# Patient Record
Sex: Female | Born: 1955
Health system: Southern US, Community
[De-identification: ages and names within clinical notes are randomized; demographics above are authoritative.]

## PROBLEM LIST (undated history)

## (undated) DIAGNOSIS — Z8042 Family history of malignant neoplasm of prostate: Secondary | ICD-10-CM

## (undated) DIAGNOSIS — E119 Type 2 diabetes mellitus without complications: Secondary | ICD-10-CM

## (undated) DIAGNOSIS — H269 Unspecified cataract: Secondary | ICD-10-CM

## (undated) DIAGNOSIS — E079 Disorder of thyroid, unspecified: Secondary | ICD-10-CM

## (undated) DIAGNOSIS — M7541 Impingement syndrome of right shoulder: Secondary | ICD-10-CM

## (undated) DIAGNOSIS — Z803 Family history of malignant neoplasm of breast: Secondary | ICD-10-CM

## (undated) DIAGNOSIS — Z800 Family history of malignant neoplasm of digestive organs: Secondary | ICD-10-CM

## (undated) DIAGNOSIS — E039 Hypothyroidism, unspecified: Secondary | ICD-10-CM

## (undated) HISTORY — DX: Family history of malignant neoplasm of prostate: Z80.42

## (undated) HISTORY — PX: OTHER SURGICAL HISTORY: SHX169

## (undated) HISTORY — DX: Unspecified cataract: H26.9

## (undated) HISTORY — DX: Family history of malignant neoplasm of breast: Z80.3

## (undated) HISTORY — PX: COLONOSCOPY: SHX174

## (undated) HISTORY — DX: Family history of malignant neoplasm of digestive organs: Z80.0

## (undated) HISTORY — DX: Type 2 diabetes mellitus without complications: E11.9

## (undated) HISTORY — DX: Disorder of thyroid, unspecified: E07.9

---

## 1999-01-14 ENCOUNTER — Other Ambulatory Visit: Admission: RE | Admit: 1999-01-14 | Discharge: 1999-01-14 | Payer: Self-pay | Admitting: Obstetrics & Gynecology

## 1999-09-05 ENCOUNTER — Encounter: Payer: Self-pay | Admitting: Obstetrics & Gynecology

## 1999-09-05 ENCOUNTER — Encounter: Admission: RE | Admit: 1999-09-05 | Discharge: 1999-09-05 | Payer: Self-pay | Admitting: Obstetrics & Gynecology

## 2000-02-25 ENCOUNTER — Other Ambulatory Visit: Admission: RE | Admit: 2000-02-25 | Discharge: 2000-02-25 | Payer: Self-pay | Admitting: Obstetrics & Gynecology

## 2000-03-09 ENCOUNTER — Encounter: Admission: RE | Admit: 2000-03-09 | Discharge: 2000-03-09 | Payer: Self-pay | Admitting: Obstetrics & Gynecology

## 2000-03-09 ENCOUNTER — Encounter: Payer: Self-pay | Admitting: Obstetrics & Gynecology

## 2000-06-18 ENCOUNTER — Emergency Department (HOSPITAL_COMMUNITY): Admission: EM | Admit: 2000-06-18 | Discharge: 2000-06-18 | Payer: Self-pay | Admitting: *Deleted

## 2000-11-09 ENCOUNTER — Encounter: Admission: RE | Admit: 2000-11-09 | Discharge: 2000-11-11 | Payer: Self-pay | Admitting: Endocrinology

## 2000-11-30 ENCOUNTER — Encounter: Admission: RE | Admit: 2000-11-30 | Discharge: 2001-02-28 | Payer: Self-pay | Admitting: Endocrinology

## 2001-02-17 ENCOUNTER — Other Ambulatory Visit: Admission: RE | Admit: 2001-02-17 | Discharge: 2001-02-17 | Payer: Self-pay | Admitting: Obstetrics & Gynecology

## 2002-02-03 ENCOUNTER — Encounter: Admission: RE | Admit: 2002-02-03 | Discharge: 2002-03-21 | Payer: Self-pay | Admitting: Occupational Medicine

## 2002-03-16 ENCOUNTER — Other Ambulatory Visit: Admission: RE | Admit: 2002-03-16 | Discharge: 2002-03-16 | Payer: Self-pay | Admitting: Obstetrics & Gynecology

## 2003-03-29 ENCOUNTER — Other Ambulatory Visit: Admission: RE | Admit: 2003-03-29 | Discharge: 2003-03-29 | Payer: Self-pay | Admitting: Obstetrics & Gynecology

## 2003-10-27 ENCOUNTER — Encounter: Admission: RE | Admit: 2003-10-27 | Discharge: 2003-10-27 | Payer: Self-pay | Admitting: Obstetrics & Gynecology

## 2004-04-19 ENCOUNTER — Other Ambulatory Visit: Admission: RE | Admit: 2004-04-19 | Discharge: 2004-04-19 | Payer: Self-pay | Admitting: Obstetrics & Gynecology

## 2004-11-13 ENCOUNTER — Encounter: Admission: RE | Admit: 2004-11-13 | Discharge: 2004-11-13 | Payer: Self-pay | Admitting: Obstetrics & Gynecology

## 2004-12-11 ENCOUNTER — Encounter: Admission: RE | Admit: 2004-12-11 | Discharge: 2004-12-11 | Payer: Self-pay | Admitting: Orthopedic Surgery

## 2005-04-16 ENCOUNTER — Encounter: Admission: RE | Admit: 2005-04-16 | Discharge: 2005-04-16 | Payer: Self-pay | Admitting: Obstetrics & Gynecology

## 2005-05-26 ENCOUNTER — Other Ambulatory Visit: Admission: RE | Admit: 2005-05-26 | Discharge: 2005-05-26 | Payer: Self-pay | Admitting: Obstetrics & Gynecology

## 2005-11-25 ENCOUNTER — Encounter: Admission: RE | Admit: 2005-11-25 | Discharge: 2005-11-25 | Payer: Self-pay | Admitting: Obstetrics & Gynecology

## 2006-01-12 ENCOUNTER — Ambulatory Visit: Payer: Self-pay | Admitting: Pulmonary Disease

## 2006-01-12 ENCOUNTER — Inpatient Hospital Stay (HOSPITAL_COMMUNITY): Admission: EM | Admit: 2006-01-12 | Discharge: 2006-01-21 | Payer: Self-pay | Admitting: Emergency Medicine

## 2006-01-14 ENCOUNTER — Ambulatory Visit: Payer: Self-pay | Admitting: Cardiology

## 2006-05-08 ENCOUNTER — Ambulatory Visit: Payer: Self-pay

## 2006-07-28 ENCOUNTER — Ambulatory Visit: Payer: Self-pay | Admitting: Internal Medicine

## 2006-08-20 ENCOUNTER — Ambulatory Visit (HOSPITAL_COMMUNITY): Admission: RE | Admit: 2006-08-20 | Discharge: 2006-08-20 | Payer: Self-pay | Admitting: Internal Medicine

## 2006-09-03 ENCOUNTER — Ambulatory Visit: Payer: Self-pay | Admitting: Internal Medicine

## 2006-12-21 ENCOUNTER — Encounter: Admission: RE | Admit: 2006-12-21 | Discharge: 2006-12-21 | Payer: Self-pay | Admitting: Obstetrics & Gynecology

## 2008-01-03 ENCOUNTER — Encounter: Admission: RE | Admit: 2008-01-03 | Discharge: 2008-01-03 | Payer: Self-pay | Admitting: Obstetrics & Gynecology

## 2009-01-13 ENCOUNTER — Encounter: Admission: RE | Admit: 2009-01-13 | Discharge: 2009-01-13 | Payer: Self-pay | Admitting: Internal Medicine

## 2009-01-16 ENCOUNTER — Encounter: Admission: RE | Admit: 2009-01-16 | Discharge: 2009-01-16 | Payer: Self-pay | Admitting: Obstetrics & Gynecology

## 2010-01-28 ENCOUNTER — Encounter: Admission: RE | Admit: 2010-01-28 | Discharge: 2010-01-28 | Payer: Self-pay | Admitting: Obstetrics & Gynecology

## 2010-02-14 ENCOUNTER — Encounter: Admission: RE | Admit: 2010-02-14 | Discharge: 2010-02-14 | Payer: Self-pay | Admitting: Obstetrics & Gynecology

## 2010-08-30 NOTE — Assessment & Plan Note (Signed)
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  She  is a diabetic, on insulin pump, and she is here today because of  findings of a polypoid growth in the rectum on exam by Dr. Jennette Kettle.  Apparently these growths were not confirmed on previous exams.  Miranda Shaw  is not really aware of any problems concerning her rectum.  She has  regular bowel habits, no rectal bleeding, no pain irritation, no history  of rectal surgery.  She has never had a colonoscopy or flexible  sigmoidoscopy.   MEDICATIONS:  1. Humalog insulin pump.  2. Levothroid 0.125 mcg daily.  3. Advil p.r.n.   PAST HISTORY:  Significant for:  1. Diabetes of 24 years.  2. Thyroid problems for 20 years.   OPERATIONS:  None.   FAMILY HISTORY:  Positive for breast cancer.   SOCIAL HISTORY:  1. She is single.  2. Has a Bachelor's degree.  3. Works in Art therapist.  4. She does smoke.  5. Does not drink alcohol.   REVIEW OF SYSTEMS:  Significant only for some voice changes.   PHYSICAL EXAM:  Blood pressure 96/62, pulse 80 and weight 136 pounds.  She was alert, oriented, in no distress.  LUNGS:  Clear to auscultation.  COR:  Normal S1, normal S2.  ABDOMEN:  Soft, normoactive bowel sounds.  ANOSCOPIC AND RECTAL EXAM:  There were two papillomatous growths  protruding through the anal canal which were easily reduced into the  anal canal into the rectal ampulla.  The rectal tone itself was normal,  there was no tenderness or irritation.  Rectal ampulla itself appeared  normal.  There were no hemorrhoids but there was a very prominent  dentate line which produced at least two large  papillomatous growths or  hypertrophied papillae.  They had a pyramidal shape and were rather  firm.  They showed no evidence of bleeding and they all appeared  fibrotic.  They were clearly located at the dentate line.  They were  easily reduced through the anal canal.  Stool was hemoccult negative.   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   PLAN:  Since patient is asymptomatic with them, I would not recommend  removing them.  The papillae usually do not have any predisposition to  become malignant and the indication for removal would be if they get  irritated when she wipes or if they bleed or if they protrude and cause  discomfort, but the patient is at the age where she ought to have a  screening colonoscopy.  She has no risk factors for it other than her  age of 15.  For that reason, we discussed colorectal screening and she  went ahead and scheduled her colon exam using a  routine colonoscopy  prep.  She will reduce her insulin pump to basal rate when she is on  clear liquids 1 day prior to the procedure and on the day of the  procedure.  She will follow up with Dr. Evlyn Kanner and Dr. Jennette Kettle for her  health maintenance.     Hedwig Morton. Juanda Chance, MD  Electronically Signed    DMB/MedQ  DD: 07/28/2006  DT: 07/28/2006  Job #: [id number]   cc:   Freddy Finner, M.D.  Tera Mater. Evlyn Kanner, M.D.

## 2011-01-07 ENCOUNTER — Other Ambulatory Visit: Payer: Self-pay | Admitting: Obstetrics & Gynecology

## 2011-01-07 DIAGNOSIS — Z1231 Encounter for screening mammogram for malignant neoplasm of breast: Secondary | ICD-10-CM

## 2011-01-08 ENCOUNTER — Other Ambulatory Visit: Payer: Self-pay | Admitting: Obstetrics & Gynecology

## 2011-01-08 DIAGNOSIS — M8580 Other specified disorders of bone density and structure, unspecified site: Secondary | ICD-10-CM

## 2011-02-26 ENCOUNTER — Ambulatory Visit: Payer: Self-pay

## 2011-02-26 ENCOUNTER — Other Ambulatory Visit: Payer: Self-pay

## 2011-03-19 ENCOUNTER — Other Ambulatory Visit: Payer: Self-pay

## 2011-03-19 ENCOUNTER — Ambulatory Visit: Payer: Self-pay

## 2011-03-26 ENCOUNTER — Ambulatory Visit
Admission: RE | Admit: 2011-03-26 | Discharge: 2011-03-26 | Disposition: A | Discharge status: Home / Self Care | Payer: PRIVATE HEALTH INSURANCE | Source: Ambulatory Visit | Attending: Obstetrics & Gynecology | Admitting: Obstetrics & Gynecology

## 2011-03-26 DIAGNOSIS — M8580 Other specified disorders of bone density and structure, unspecified site: Secondary | ICD-10-CM

## 2011-03-26 DIAGNOSIS — Z1231 Encounter for screening mammogram for malignant neoplasm of breast: Secondary | ICD-10-CM

## 2011-04-02 ENCOUNTER — Other Ambulatory Visit: Payer: Self-pay | Admitting: Obstetrics & Gynecology

## 2011-04-02 DIAGNOSIS — R928 Other abnormal and inconclusive findings on diagnostic imaging of breast: Secondary | ICD-10-CM

## 2011-04-17 ENCOUNTER — Ambulatory Visit
Admission: RE | Admit: 2011-04-17 | Discharge: 2011-04-17 | Disposition: A | Discharge status: Home / Self Care | Payer: PRIVATE HEALTH INSURANCE | Source: Ambulatory Visit | Attending: Obstetrics & Gynecology | Admitting: Obstetrics & Gynecology

## 2011-04-17 DIAGNOSIS — R928 Other abnormal and inconclusive findings on diagnostic imaging of breast: Secondary | ICD-10-CM

## 2011-09-25 ENCOUNTER — Ambulatory Visit (INDEPENDENT_AMBULATORY_CARE_PROVIDER_SITE_OTHER): Payer: PRIVATE HEALTH INSURANCE | Admitting: Family Medicine

## 2011-09-25 VITALS — BP 126/77 | HR 91 | Temp 97.6°F | Resp 16 | Ht 65.5 in | Wt 149.0 lb

## 2011-09-25 DIAGNOSIS — L723 Sebaceous cyst: Secondary | ICD-10-CM

## 2011-09-25 DIAGNOSIS — L0291 Cutaneous abscess, unspecified: Secondary | ICD-10-CM

## 2011-09-25 MED ORDER — DOXYCYCLINE HYCLATE 100 MG PO TABS: 100.0000 mg | ORAL_TABLET | Freq: Two times a day (BID) | ORAL | Status: AC

## 2011-09-25 NOTE — Progress Notes (Signed)
Subjective: 56 year old lady who is here for a cyst underneath her right breast. She is known that's been there for several years. It has stayed about the same size. Its been looked at in she's been told that it was a harmless cyst. Over the last 3 or 4 days it has gotten larger. Yesterday some malodorous secretion was coming out of it. There is just a little tiny poor that is open. She has had some breast evaluation last winter. She had a mammogram that was done in December and then following that had to have ultrasound in another view of her left breast. That was all benign. She's not any cysts elsewhere. She does have diabetes, has had it for 30 some years, and is on an insulin pump.  Objective: Underneath her right breast, about midline under the nipple line , she has a mildly erythematous cystic nodule. This measures approximately 4 cm in diameter. There is a tiny right lobe at the apex of the cyst. In just gentle squeezing it I did not get anything out of it. No axillary node.  Assessment: Infected sebaceous cyst  Plan: I&D of cyst Antibiotics

## 2011-09-25 NOTE — Progress Notes (Signed)
VCO. Local anesthesia with 1.5 cc 2% lidocaine plain.  Prep with betadine.  4 mm punch to open the cyst over the dilated pore.  Moderate purulence and moderate sebaceous material expressed.  Culture obtained. Parts of the cyst sac also removed.  Wound irrigated with 3 cc 2% lidocaine plain.  Gently packed with 1/4 inch plain packing.  Telfa, gauze and Hypafix dressing applied.  Warm compresses.  Return for wound care on Saturday 09/27/2011 with Ms. Davonna Belling, PA-C or Ms. Marte, PA-C.

## 2011-09-25 NOTE — Patient Instructions (Signed)
Use a warm compress on the area for 20 minutes twice daily.  Replace the dressing if it becomes saturated, leaks or gets wet.

## 2011-09-27 ENCOUNTER — Ambulatory Visit (INDEPENDENT_AMBULATORY_CARE_PROVIDER_SITE_OTHER): Payer: PRIVATE HEALTH INSURANCE | Admitting: Family Medicine

## 2011-09-27 VITALS — BP 111/77 | HR 101 | Temp 98.2°F | Resp 16 | Ht 65.5 in | Wt 146.0 lb

## 2011-09-27 DIAGNOSIS — Z09 Encounter for follow-up examination after completed treatment for conditions other than malignant neoplasm: Secondary | ICD-10-CM

## 2011-09-27 DIAGNOSIS — Z5189 Encounter for other specified aftercare: Secondary | ICD-10-CM

## 2011-09-27 NOTE — Progress Notes (Signed)
    Patient Name: Miranda Shaw Date of Birth: April 13, 1956 Medical Record Number: [id number] Gender: female Date of Encounter: 09/27/2011  History of Present Illness:  Miranda Shaw is a 56 y.o. very pleasant female patient who presents with the following:  Here for wound care- see I and D sebaceous cyst under right breast 2 days ago.  She feels that she is doing very well, her pain is much decreased.  No fever, tolerating abx ok  There is no problem list on file for this patient.  No past medical history on file. No past surgical history on file. History  Substance Use Topics  . Smoking status: Former Smoker    Quit date: 09/26/2001  . Smokeless tobacco: Not on file  . Alcohol Use: Not on file   No family history on file. No Known Allergies  Medication list has been reviewed and updated.  Prior to Admission medications   Medication Sig Start Date End Date Taking? Authorizing Provider  doxycycline (VIBRA-TABS) 100 MG tablet Take 1 tablet (100 mg total) by mouth 2 (two) times daily. 09/25/11 10/05/11 Yes Peyton Najjar, MD  Insulin Human (INSULIN PUMP) 100 unit/ml SOLN Inject into the skin.   Yes Historical Provider, MD    Review of Systems:  As per HPI- otherwise negative.   Physical Examination: Filed Vitals:   09/27/11 1438  BP: 111/77  Pulse: 101  Temp: 98.2 F (36.8 C)  Resp: 16   Filed Vitals:   09/27/11 1438  Height: 5' 5.5" (1.664 m)  Weight: 146 lb (66.225 kg)   Body mass index is 23.93 kg/(m^2). Ideal Body Weight: Weight in (lb) to have BMI = 25: 152.2    GEN: WDWN, NAD, Non-toxic, Alert & Oriented x 3 HEENT: Atraumatic, Normocephalic.  Ears and Nose: No external deformity. EXTR: No clubbing/cyanosis/edema NEURO: Normal gait.  PSYCH: Normally interactive. Conversant. Not depressed or anxious appearing.  Calm demeanor.  Under right breast there is a sebaceous cyst, s/p I and D.  Removed bandage and packing, no drainage, minimal  induration.  Replaced a small piece of packing and dressed  Assessment and Plan: 1. Encounter for wound care    Wound culture so far suggests sebaceous cyst.  There is really no drainage at this time.  She may remove the packing in 2 days- if still no drainage and doing well does not have to RTC.  If any concerns or significant drainage come back.    Abbe Amsterdam, MD

## 2011-09-28 LAB — WOUND CULTURE: Gram Stain: NONE SEEN

## 2011-09-29 ENCOUNTER — Encounter: Payer: Self-pay | Admitting: Family Medicine

## 2012-03-09 ENCOUNTER — Other Ambulatory Visit: Payer: Self-pay | Admitting: Obstetrics & Gynecology

## 2012-03-09 DIAGNOSIS — Z1231 Encounter for screening mammogram for malignant neoplasm of breast: Secondary | ICD-10-CM

## 2012-04-19 ENCOUNTER — Ambulatory Visit: Payer: PRIVATE HEALTH INSURANCE

## 2012-04-19 ENCOUNTER — Ambulatory Visit
Admission: RE | Admit: 2012-04-19 | Discharge: 2012-04-19 | Disposition: A | Discharge status: Home / Self Care | Payer: PRIVATE HEALTH INSURANCE | Source: Ambulatory Visit | Attending: Obstetrics & Gynecology | Admitting: Obstetrics & Gynecology

## 2012-04-19 DIAGNOSIS — Z1231 Encounter for screening mammogram for malignant neoplasm of breast: Secondary | ICD-10-CM

## 2013-10-20 ENCOUNTER — Other Ambulatory Visit: Payer: Self-pay

## 2013-10-20 DIAGNOSIS — Z1231 Encounter for screening mammogram for malignant neoplasm of breast: Secondary | ICD-10-CM

## 2013-10-28 ENCOUNTER — Ambulatory Visit
Admission: RE | Admit: 2013-10-28 | Discharge: 2013-10-28 | Disposition: A | Discharge status: Home / Self Care | Payer: PRIVATE HEALTH INSURANCE | Source: Ambulatory Visit

## 2013-10-28 DIAGNOSIS — Z1231 Encounter for screening mammogram for malignant neoplasm of breast: Secondary | ICD-10-CM

## 2014-09-26 ENCOUNTER — Other Ambulatory Visit: Payer: Self-pay

## 2014-09-26 DIAGNOSIS — Z1231 Encounter for screening mammogram for malignant neoplasm of breast: Secondary | ICD-10-CM

## 2014-11-20 ENCOUNTER — Ambulatory Visit
Admission: RE | Admit: 2014-11-20 | Discharge: 2014-11-20 | Disposition: A | Discharge status: Home / Self Care | Payer: PRIVATE HEALTH INSURANCE | Source: Ambulatory Visit

## 2014-11-20 DIAGNOSIS — Z1231 Encounter for screening mammogram for malignant neoplasm of breast: Secondary | ICD-10-CM

## 2015-07-18 MED FILL — HumaLOG 100 UNIT/ML SOLN: 100 | 80 days supply | Qty: 40 | Fill #0

## 2015-07-18 MED FILL — LEVOTHYROXINE 137 MCG TAB: 137 | 90 days supply | Qty: 90 | Fill #0

## 2015-08-15 DIAGNOSIS — E1065 Type 1 diabetes mellitus with hyperglycemia: Secondary | ICD-10-CM | POA: Diagnosis not present

## 2015-08-15 DIAGNOSIS — Z1389 Encounter for screening for other disorder: Secondary | ICD-10-CM | POA: Diagnosis not present

## 2015-08-15 DIAGNOSIS — M810 Age-related osteoporosis without current pathological fracture: Secondary | ICD-10-CM | POA: Diagnosis not present

## 2015-08-15 DIAGNOSIS — E11319 Type 2 diabetes mellitus with unspecified diabetic retinopathy without macular edema: Secondary | ICD-10-CM | POA: Diagnosis not present

## 2015-08-15 DIAGNOSIS — E109 Type 1 diabetes mellitus without complications: Secondary | ICD-10-CM | POA: Diagnosis not present

## 2015-08-15 DIAGNOSIS — E038 Other specified hypothyroidism: Secondary | ICD-10-CM | POA: Diagnosis not present

## 2015-08-15 DIAGNOSIS — E784 Other hyperlipidemia: Secondary | ICD-10-CM | POA: Diagnosis not present

## 2015-08-15 DIAGNOSIS — F320 Major depressive disorder, single episode, mild: Secondary | ICD-10-CM | POA: Diagnosis not present

## 2015-08-15 DIAGNOSIS — Z6825 Body mass index (BMI) 25.0-25.9, adult: Secondary | ICD-10-CM | POA: Diagnosis not present

## 2015-08-15 DIAGNOSIS — E559 Vitamin D deficiency, unspecified: Secondary | ICD-10-CM | POA: Diagnosis not present

## 2015-08-23 MED FILL — LEVOTHYROXINE 175 MCG TAB: 175 | 14 days supply | Qty: 14 | Fill #0

## 2015-08-23 MED FILL — ONE TOUCH DELICA 33G LANCET: 40 days supply | Qty: 200 | Fill #0

## 2015-08-23 MED FILL — LEVOTHYROXINE 200 MCG TAB: 200 | 30 days supply | Qty: 30 | Fill #0

## 2015-08-23 MED FILL — ONE TOUCH VERIO TEST STRIP: 30 days supply | Qty: 150 | Fill #0

## 2015-08-30 DIAGNOSIS — E1065 Type 1 diabetes mellitus with hyperglycemia: Secondary | ICD-10-CM | POA: Diagnosis not present

## 2015-08-30 DIAGNOSIS — Z6825 Body mass index (BMI) 25.0-25.9, adult: Secondary | ICD-10-CM | POA: Diagnosis not present

## 2015-08-30 DIAGNOSIS — Z4681 Encounter for fitting and adjustment of insulin pump: Secondary | ICD-10-CM | POA: Diagnosis not present

## 2015-10-11 DIAGNOSIS — E1065 Type 1 diabetes mellitus with hyperglycemia: Secondary | ICD-10-CM | POA: Diagnosis not present

## 2015-10-11 DIAGNOSIS — Z6825 Body mass index (BMI) 25.0-25.9, adult: Secondary | ICD-10-CM | POA: Diagnosis not present

## 2015-10-11 DIAGNOSIS — E038 Other specified hypothyroidism: Secondary | ICD-10-CM | POA: Diagnosis not present

## 2015-10-11 DIAGNOSIS — E039 Hypothyroidism, unspecified: Secondary | ICD-10-CM | POA: Diagnosis not present

## 2015-10-11 DIAGNOSIS — Z4681 Encounter for fitting and adjustment of insulin pump: Secondary | ICD-10-CM | POA: Diagnosis not present

## 2015-10-11 MED FILL — LEVOTHYROXINE 175 MCG TAB: 175 | 30 days supply | Qty: 30 | Fill #0

## 2015-10-18 MED FILL — HumaLOG 100 UNIT/ML SOLN: 100 | 80 days supply | Qty: 40 | Fill #1

## 2015-10-18 MED FILL — ONE TOUCH VERIO TEST STRIP: 30 days supply | Qty: 150 | Fill #1

## 2015-10-30 MED FILL — SF 5000 PLUS CREAM: 1.1 | 30 days supply | Qty: 102 | Fill #0

## 2015-10-31 MED FILL — HYDROCODON-APAP 5-325: 5-325 | 1 days supply | Qty: 10 | Fill #0

## 2015-10-31 MED FILL — AMOXICILLIN 500 MG CAPSULE: 500 | 6 days supply | Qty: 25 | Fill #0

## 2015-10-31 MED FILL — IBUPROFEN 800 MG TABLET: 800 | 4 days supply | Qty: 16 | Fill #0

## 2015-11-04 DIAGNOSIS — E109 Type 1 diabetes mellitus without complications: Secondary | ICD-10-CM | POA: Diagnosis not present

## 2015-11-22 MED FILL — ONE TOUCH VERIO TEST STRIP: 30 days supply | Qty: 150 | Fill #2

## 2015-11-22 MED FILL — LEVOTHYROXINE 175 MCG TAB: 175 | 30 days supply | Qty: 30 | Fill #1

## 2015-12-19 DIAGNOSIS — E038 Other specified hypothyroidism: Secondary | ICD-10-CM | POA: Diagnosis not present

## 2015-12-19 DIAGNOSIS — E784 Other hyperlipidemia: Secondary | ICD-10-CM | POA: Diagnosis not present

## 2015-12-19 DIAGNOSIS — E10319 Type 1 diabetes mellitus with unspecified diabetic retinopathy without macular edema: Secondary | ICD-10-CM | POA: Diagnosis not present

## 2015-12-19 DIAGNOSIS — E11319 Type 2 diabetes mellitus with unspecified diabetic retinopathy without macular edema: Secondary | ICD-10-CM | POA: Diagnosis not present

## 2015-12-19 DIAGNOSIS — Z6825 Body mass index (BMI) 25.0-25.9, adult: Secondary | ICD-10-CM | POA: Diagnosis not present

## 2015-12-19 DIAGNOSIS — E559 Vitamin D deficiency, unspecified: Secondary | ICD-10-CM | POA: Diagnosis not present

## 2015-12-31 MED FILL — ONE TOUCH VERIO TEST STRIP: 30 days supply | Qty: 150 | Fill #3

## 2015-12-31 MED FILL — LEVOTHYROXINE 175 MCG TAB: 175 | 30 days supply | Qty: 30 | Fill #2

## 2015-12-31 MED FILL — HumaLOG 100 UNIT/ML SOLN: 100 | 80 days supply | Qty: 40 | Fill #2

## 2016-01-22 ENCOUNTER — Other Ambulatory Visit (HOSPITAL_BASED_OUTPATIENT_CLINIC_OR_DEPARTMENT_OTHER): Payer: Self-pay | Admitting: Obstetrics & Gynecology

## 2016-01-22 DIAGNOSIS — Z1231 Encounter for screening mammogram for malignant neoplasm of breast: Secondary | ICD-10-CM

## 2016-01-24 ENCOUNTER — Ambulatory Visit (HOSPITAL_BASED_OUTPATIENT_CLINIC_OR_DEPARTMENT_OTHER)
Admission: RE | Admit: 2016-01-24 | Discharge: 2016-01-24 | Disposition: A | Discharge status: Home / Self Care | Payer: 59 | Source: Ambulatory Visit | Attending: Obstetrics & Gynecology | Admitting: Obstetrics & Gynecology

## 2016-01-24 DIAGNOSIS — Z1231 Encounter for screening mammogram for malignant neoplasm of breast: Secondary | ICD-10-CM | POA: Diagnosis not present

## 2016-01-30 DIAGNOSIS — E10319 Type 1 diabetes mellitus with unspecified diabetic retinopathy without macular edema: Secondary | ICD-10-CM | POA: Diagnosis not present

## 2016-01-30 DIAGNOSIS — Z6825 Body mass index (BMI) 25.0-25.9, adult: Secondary | ICD-10-CM | POA: Diagnosis not present

## 2016-01-30 DIAGNOSIS — Z4681 Encounter for fitting and adjustment of insulin pump: Secondary | ICD-10-CM | POA: Diagnosis not present

## 2016-02-06 DIAGNOSIS — E109 Type 1 diabetes mellitus without complications: Secondary | ICD-10-CM | POA: Diagnosis not present

## 2016-02-06 MED FILL — ONE TOUCH VERIO TEST STRIP: 30 days supply | Qty: 150 | Fill #4

## 2016-02-06 MED FILL — LEVOTHYROXINE 175 MCG TAB: 175 | 30 days supply | Qty: 30 | Fill #3

## 2016-02-28 DIAGNOSIS — H5203 Hypermetropia, bilateral: Secondary | ICD-10-CM | POA: Diagnosis not present

## 2016-03-17 MED FILL — ONE TOUCH VERIO TEST STRIP: 30 days supply | Qty: 150 | Fill #5

## 2016-03-17 MED FILL — HumaLOG 100 UNIT/ML SOLN: 100 | 80 days supply | Qty: 40 | Fill #3

## 2016-03-17 MED FILL — LEVOTHYROXINE 175 MCG TAB: 175 | 30 days supply | Qty: 30 | Fill #4

## 2016-04-11 DIAGNOSIS — E109 Type 1 diabetes mellitus without complications: Secondary | ICD-10-CM | POA: Diagnosis not present

## 2016-04-13 DIAGNOSIS — E109 Type 1 diabetes mellitus without complications: Secondary | ICD-10-CM | POA: Diagnosis not present

## 2016-05-06 DIAGNOSIS — E109 Type 1 diabetes mellitus without complications: Secondary | ICD-10-CM | POA: Diagnosis not present

## 2016-05-09 DIAGNOSIS — E109 Type 1 diabetes mellitus without complications: Secondary | ICD-10-CM | POA: Diagnosis not present

## 2016-05-16 DIAGNOSIS — Z6825 Body mass index (BMI) 25.0-25.9, adult: Secondary | ICD-10-CM | POA: Diagnosis not present

## 2016-05-16 DIAGNOSIS — E10319 Type 1 diabetes mellitus with unspecified diabetic retinopathy without macular edema: Secondary | ICD-10-CM | POA: Diagnosis not present

## 2016-05-16 DIAGNOSIS — Z4681 Encounter for fitting and adjustment of insulin pump: Secondary | ICD-10-CM | POA: Diagnosis not present

## 2016-05-16 MED FILL — CONTOUR NEXT STRIPS: 30 days supply | Qty: 200 | Fill #0

## 2016-05-19 MED FILL — LEVOTHYROXINE 175 MCG TAB: 175 | 30 days supply | Qty: 30 | Fill #5

## 2016-06-02 MED FILL — AMOXICILLIN 500 MG CAPSULE: 500 | 5 days supply | Qty: 15 | Fill #0

## 2016-06-02 MED FILL — IBUPROFEN 800 MG TABLET: 800 | 4 days supply | Qty: 12 | Fill #0

## 2016-06-05 DIAGNOSIS — Z6826 Body mass index (BMI) 26.0-26.9, adult: Secondary | ICD-10-CM | POA: Diagnosis not present

## 2016-06-05 DIAGNOSIS — Z4681 Encounter for fitting and adjustment of insulin pump: Secondary | ICD-10-CM | POA: Diagnosis not present

## 2016-06-05 DIAGNOSIS — E10319 Type 1 diabetes mellitus with unspecified diabetic retinopathy without macular edema: Secondary | ICD-10-CM | POA: Diagnosis not present

## 2016-06-19 MED FILL — HumaLOG 100 UNIT/ML SOLN: 100 | 40 days supply | Qty: 20 | Fill #4

## 2016-06-19 MED FILL — CONTOUR NEXT STRIPS: 30 days supply | Qty: 300 | Fill #1

## 2016-06-20 DIAGNOSIS — E109 Type 1 diabetes mellitus without complications: Secondary | ICD-10-CM | POA: Diagnosis not present

## 2016-06-20 MED FILL — LEVOTHYROXINE 175 MCG TAB: 175 | 30 days supply | Qty: 30 | Fill #0

## 2016-07-09 DIAGNOSIS — Z01419 Encounter for gynecological examination (general) (routine) without abnormal findings: Secondary | ICD-10-CM | POA: Diagnosis not present

## 2016-07-09 DIAGNOSIS — Z6826 Body mass index (BMI) 26.0-26.9, adult: Secondary | ICD-10-CM | POA: Diagnosis not present

## 2016-07-10 ENCOUNTER — Encounter: Payer: Self-pay | Admitting: Gastroenterology

## 2016-07-10 MED FILL — ALPRAZolam 0.5 MG TABS: 0.5 | 30 days supply | Qty: 90 | Fill #0

## 2016-07-27 DIAGNOSIS — E109 Type 1 diabetes mellitus without complications: Secondary | ICD-10-CM | POA: Diagnosis not present

## 2016-07-30 MED FILL — LEVOTHYROXINE 175 MCG TAB: 175 | 30 days supply | Qty: 30 | Fill #1

## 2016-07-30 MED FILL — AMOXICILLIN 500 MG CAPSULE: 500 | 14 days supply | Qty: 40 | Fill #0

## 2016-07-30 MED FILL — CONTOUR NEXT STRIPS: 30 days supply | Qty: 300 | Fill #2

## 2016-07-30 MED FILL — IBUPROFEN 800 MG TABLET: 800 | 4 days supply | Qty: 16 | Fill #0

## 2016-07-31 MED FILL — traMADol HCL 50 MG TABS: 50 | 2 days supply | Qty: 10 | Fill #0

## 2016-08-07 DIAGNOSIS — E109 Type 1 diabetes mellitus without complications: Secondary | ICD-10-CM | POA: Diagnosis not present

## 2016-08-26 ENCOUNTER — Encounter: Payer: Self-pay | Admitting: Gastroenterology

## 2016-09-01 DIAGNOSIS — E784 Other hyperlipidemia: Secondary | ICD-10-CM | POA: Diagnosis not present

## 2016-09-01 DIAGNOSIS — E10319 Type 1 diabetes mellitus with unspecified diabetic retinopathy without macular edema: Secondary | ICD-10-CM | POA: Diagnosis not present

## 2016-09-01 DIAGNOSIS — E038 Other specified hypothyroidism: Secondary | ICD-10-CM | POA: Diagnosis not present

## 2016-09-01 DIAGNOSIS — E559 Vitamin D deficiency, unspecified: Secondary | ICD-10-CM | POA: Diagnosis not present

## 2016-09-01 DIAGNOSIS — M810 Age-related osteoporosis without current pathological fracture: Secondary | ICD-10-CM | POA: Diagnosis not present

## 2016-09-01 DIAGNOSIS — Z1389 Encounter for screening for other disorder: Secondary | ICD-10-CM | POA: Diagnosis not present

## 2016-09-01 DIAGNOSIS — Z6825 Body mass index (BMI) 25.0-25.9, adult: Secondary | ICD-10-CM | POA: Diagnosis not present

## 2016-09-01 DIAGNOSIS — E11319 Type 2 diabetes mellitus with unspecified diabetic retinopathy without macular edema: Secondary | ICD-10-CM | POA: Diagnosis not present

## 2016-09-01 MED FILL — ROSUVASTATIN CALCIUM 10 MG: 10 | 37 days supply | Qty: 15 | Fill #0

## 2016-09-02 MED FILL — LEVOTHYROXINE 175 MCG TAB: 175 | 30 days supply | Qty: 30 | Fill #2

## 2016-09-02 MED FILL — CONTOUR NEXT STRIPS: 30 days supply | Qty: 300 | Fill #3

## 2016-09-03 DIAGNOSIS — Z4681 Encounter for fitting and adjustment of insulin pump: Secondary | ICD-10-CM | POA: Diagnosis not present

## 2016-09-03 DIAGNOSIS — E10319 Type 1 diabetes mellitus with unspecified diabetic retinopathy without macular edema: Secondary | ICD-10-CM | POA: Diagnosis not present

## 2016-09-03 DIAGNOSIS — Z6826 Body mass index (BMI) 26.0-26.9, adult: Secondary | ICD-10-CM | POA: Diagnosis not present

## 2016-09-04 MED FILL — HumaLOG 100 UNIT/ML SOLN: 100 | 80 days supply | Qty: 40 | Fill #0

## 2016-09-10 DIAGNOSIS — E109 Type 1 diabetes mellitus without complications: Secondary | ICD-10-CM | POA: Diagnosis not present

## 2016-10-07 MED FILL — CONTOUR NEXT STRIPS: 30 days supply | Qty: 300 | Fill #4

## 2016-10-07 MED FILL — LEVOTHYROXINE 175 MCG TAB: 175 | 30 days supply | Qty: 30 | Fill #3

## 2016-10-07 MED FILL — ROSUVASTATIN CALCIUM 10 MG: 10 | 37 days supply | Qty: 15 | Fill #1

## 2016-10-16 DIAGNOSIS — E109 Type 1 diabetes mellitus without complications: Secondary | ICD-10-CM | POA: Diagnosis not present

## 2016-10-17 ENCOUNTER — Ambulatory Visit (AMBULATORY_SURGERY_CENTER): Payer: Self-pay | Admitting: *Deleted

## 2016-10-17 ENCOUNTER — Telehealth: Payer: Self-pay | Admitting: *Deleted

## 2016-10-17 VITALS — Ht 65.0 in | Wt 159.4 lb

## 2016-10-17 DIAGNOSIS — Z1211 Encounter for screening for malignant neoplasm of colon: Secondary | ICD-10-CM

## 2016-10-17 MED ORDER — NA SULFATE-K SULFATE-MG SULF 17.5-3.13-1.6 GM/177ML PO SOLN: 1.0000 | Freq: Once | ORAL | 0 refills | Status: AC

## 2016-10-17 MED FILL — SUPREP BOWEL PREP KIT: 17.5-3.13-1 | 1 days supply | Qty: 354 | Fill #0

## 2016-10-17 NOTE — Telephone Encounter (Signed)
Spoke to Kaycee,  We can send to Dr Forde Dandy to get instructions for her pump  Thanks, Lelan Pons

## 2016-10-17 NOTE — Telephone Encounter (Signed)
This patient has to be seen in the office  Dr Silverio Decamp has not seen her and we have no idea who her prescriber of her insulin pump is   Per Barb Merino   App appointments appointment

## 2016-10-17 NOTE — Telephone Encounter (Signed)
Robin,  Pt is on an insulin pump. Please get her instructions for her procedure 10-31-16 Friday .  Thanks a Nolene Bernheim PV

## 2016-10-17 NOTE — Telephone Encounter (Signed)
Miranda Shaw [id number] [id number]  Dear Dr. :Caleen Jobs, MD has scheduled the above patient for a colonoscopy and endoscopy at 10/31/2016 .  Our records show that he/she is on insulin therapy via an insulin pump.  Our colonoscopy prep protocol requires that:  the patient must be on a clear liquid diet the entire day prior to the procedure date as well as the morning of the procedure  the patient must be NPO for 2 hours prior to the procedure   the patient must consume a PEG 3350 solution to prepare for the procedure.  Please advise [place] of any adjustments that need to be made to the patient's insulin pump therapy prior to the above procedure date.    Please route or fax back this completed form to me at [phone] .  If you have any question, please call me at [phone].  Thank you for your help with this matter.  Sincerely,    Tonita Phoenix AAMA

## 2016-10-17 NOTE — Progress Notes (Signed)
No egg or soy allergy known to patient  No issues with past sedation with any surgeries  or procedures, no intubation problems  No diet pills per patient No home 02 use per patient  No blood thinners per patient  Pt denies issues with constipation  No A fib or A flutter  EMMI video sent to pt's e mail - pt declined TE to [place] to get instructions about insulin pump for pt- pt informed she will be notified by office  15$ coupon to pt for suprep

## 2016-10-22 NOTE — Telephone Encounter (Signed)
Per Letter from Dr Forde Dandy Patient is to remain on insulin pump while preping for colonoscopy , no pump adjustments needed  Will fax letter to be scanned in    Called patient to inform and had to leave a message to return call back to the office    FYI Dr Silverio Decamp  Patient is to remain on insulin pump for her colonoscopy per Dr Forde Dandy which is scheduled for 7/20/218

## 2016-10-24 NOTE — Telephone Encounter (Signed)
Patient called back I explained to her no insulin pump changes   Pt said she is already aware

## 2016-10-27 ENCOUNTER — Encounter: Payer: Self-pay | Admitting: Gastroenterology

## 2016-10-28 DIAGNOSIS — E109 Type 1 diabetes mellitus without complications: Secondary | ICD-10-CM | POA: Diagnosis not present

## 2016-10-31 ENCOUNTER — Ambulatory Visit (AMBULATORY_SURGERY_CENTER): Payer: 59 | Admitting: Gastroenterology

## 2016-10-31 ENCOUNTER — Encounter: Payer: Self-pay | Admitting: Gastroenterology

## 2016-10-31 VITALS — BP 108/68 | HR 72 | Temp 97.7°F | Resp 18 | Ht 65.0 in | Wt 159.0 lb

## 2016-10-31 DIAGNOSIS — E119 Type 2 diabetes mellitus without complications: Secondary | ICD-10-CM | POA: Diagnosis not present

## 2016-10-31 DIAGNOSIS — E669 Obesity, unspecified: Secondary | ICD-10-CM | POA: Diagnosis not present

## 2016-10-31 DIAGNOSIS — Z1212 Encounter for screening for malignant neoplasm of rectum: Secondary | ICD-10-CM | POA: Diagnosis not present

## 2016-10-31 DIAGNOSIS — Z1211 Encounter for screening for malignant neoplasm of colon: Secondary | ICD-10-CM | POA: Diagnosis present

## 2016-10-31 MED ORDER — SODIUM CHLORIDE 0.9 % IV SOLN: 500.0000 mL | INTRAVENOUS | Status: AC

## 2016-10-31 NOTE — Progress Notes (Signed)
Report to PACU, RN, vss, BBS= Clear.  

## 2016-10-31 NOTE — Patient Instructions (Signed)
YOU HAD AN ENDOSCOPIC PROCEDURE TODAY AT [facility]:   Refer to the procedure report that was given to you for any specific questions about what was found during the examination.  If the procedure report does not answer your questions, please call your gastroenterologist to clarify.  If you requested that your care partner not be given the details of your procedure findings, then the procedure report has been included in a sealed envelope for you to review at your convenience later.  YOU SHOULD EXPECT: Some feelings of bloating in the abdomen. Passage of more gas than usual.  Walking can help get rid of the air that was put into your GI tract during the procedure and reduce the bloating. If you had a lower endoscopy (such as a colonoscopy or flexible sigmoidoscopy) you may notice spotting of blood in your stool or on the toilet paper. If you underwent a bowel prep for your procedure, you may not have a normal bowel movement for a few days.  Please Note:  You might notice some irritation and congestion in your nose or some drainage.  This is from the oxygen used during your procedure.  There is no need for concern and it should clear up in a day or so.  SYMPTOMS TO REPORT IMMEDIATELY:   Following lower endoscopy (colonoscopy or flexible sigmoidoscopy):  Excessive amounts of blood in the stool  Significant tenderness or worsening of abdominal pains  Swelling of the abdomen that is new, acute  Fever of 100F or higher  For urgent or emergent issues, a gastroenterologist can be reached at any hour by calling [phone].   DIET:  We do recommend a small meal at first, but then you may proceed to your regular diet.  Drink plenty of fluids but you should avoid alcoholic beverages for 24 hours.  ACTIVITY:  You should plan to take it easy for the rest of today and you should NOT DRIVE or use heavy machinery until tomorrow (because of the sedation medicines used during the test).     FOLLOW UP: Our staff will call the number listed on your records the next business day following your procedure to check on you and address any questions or concerns that you may have regarding the information given to you following your procedure. If we do not reach you, we will leave a message.  However, if you are feeling well and you are not experiencing any problems, there is no need to return our call.  We will assume that you have returned to your regular daily activities without incident.  If any biopsies were taken you will be contacted by phone or by letter within the next 1-3 weeks.  Please call us at [phone] if you have not heard about the biopsies in 3 weeks.    SIGNATURES/CONFIDENTIALITY: You and/or your care partner have signed paperwork which will be entered into your electronic medical record.  These signatures attest to the fact that that the information above on your After Visit Summary has been reviewed and is understood.  Full responsibility of the confidentiality of this discharge information lies with you and/or your care-partner.    Handout was given to your care partner on hemorrhoids. You may resume your current medications today. Your blood sugar was 149 in the recovery room. Repeat next colonoscopy in 10 years for next screening exam. Please call if any questions or concerns.

## 2016-10-31 NOTE — Progress Notes (Signed)
No problems noted in the recovery room. maw 

## 2016-10-31 NOTE — Op Note (Signed)
[facility] Patient Name: Miranda Shaw Procedure Date: 10/31/2016 1:56 PM MRN: [id number] Endoscopist: Mauri Pole , MD Age: 61 Referring MD:  Date of Birth: Nov 14, 1955 Gender: Female Account #: [id number] Procedure:                Colonoscopy Indications:              Screening for colorectal malignant neoplasm, Last                            colonoscopy: 2008 Medicines:                Monitored Anesthesia Care Procedure:                Pre-Anesthesia Assessment:                           - Prior to the procedure, a History and Physical                            was performed, and patient medications and                            allergies were reviewed. The patient's tolerance of                            previous anesthesia was also reviewed. The risks                            and benefits of the procedure and the sedation                            options and risks were discussed with the patient.                            All questions were answered, and informed consent                            was obtained. Prior Anticoagulants: The patient has                            taken no previous anticoagulant or antiplatelet                            agents. ASA Grade Assessment: II - A patient with                            mild systemic disease. After reviewing the risks                            and benefits, the patient was deemed in                            satisfactory condition to undergo the procedure.  After obtaining informed consent, the colonoscope                            was passed under direct vision. Throughout the                            procedure, the patient's blood pressure, pulse, and                            oxygen saturations were monitored continuously. The                            Colonoscope was introduced through the anus and                            advanced to the the terminal ileum,  with                            identification of the appendiceal orifice and IC                            valve. The colonoscopy was performed without                            difficulty. The patient tolerated the procedure                            well. The quality of the bowel preparation was                            excellent. The terminal ileum, ileocecal valve,                            appendiceal orifice, and rectum were photographed. Scope In: 1:58:11 PM Scope Out: 2:13:33 PM Scope Withdrawal Time: 0 hours 11 minutes 0 seconds  Total Procedure Duration: 0 hours 15 minutes 22 seconds  Findings:                 The perianal and digital rectal examinations were                            normal.                           Non-bleeding internal hemorrhoids were found during                            retroflexion. The hemorrhoids were small.                           The exam was otherwise without abnormality. Complications:            No immediate complications. Estimated Blood Loss:     Estimated blood loss: none. Impression:               - Non-bleeding internal hemorrhoids.                           -  The examination was otherwise normal.                           - No specimens collected. Recommendation:           - Patient has a contact number available for                            emergencies. The signs and symptoms of potential                            delayed complications were discussed with the                            patient. Return to normal activities tomorrow.                            Written discharge instructions were provided to the                            patient.                           - Resume previous diet.                           - Continue present medications.                           - Repeat colonoscopy in 10 years for screening                            purposes.                           - Return to GI clinic PRN. Mauri Pole, MD 10/31/2016 2:16:46 PM This report has been signed electronically.

## 2016-11-03 ENCOUNTER — Telehealth: Payer: Self-pay

## 2016-11-03 NOTE — Telephone Encounter (Signed)
  Follow up Call-  Call back number 10/31/2016  Post procedure Call Back phone  # [phone]  Permission to leave phone message Yes  Some recent data might be hidden     Patient questions:  Do you have a fever, pain , or abdominal swelling? No. Pain Score  0 *  Have you tolerated food without any problems? Yes.    Have you been able to return to your normal activities? Yes.    Do you have any questions about your discharge instructions: Diet   No. Medications  No. Follow up visit  No.  Do you have questions or concerns about your Care? No.  Actions: * If pain score is 4 or above: No action needed, pain <4.  No problems noted per pt. maw

## 2016-11-14 MED FILL — LEVOTHYROXINE 175 MCG TAB: 175 | 30 days supply | Qty: 30 | Fill #4

## 2016-11-14 MED FILL — ROSUVASTATIN CALCIUM 10 MG: 10 | 37 days supply | Qty: 15 | Fill #2

## 2016-11-14 MED FILL — CONTOUR NEXT STRIPS: 30 days supply | Qty: 300 | Fill #5

## 2016-11-14 MED FILL — HumaLOG 100 UNIT/ML SOLN: 100 | 80 days supply | Qty: 40 | Fill #1

## 2016-12-03 DIAGNOSIS — Z4681 Encounter for fitting and adjustment of insulin pump: Secondary | ICD-10-CM | POA: Diagnosis not present

## 2016-12-03 DIAGNOSIS — Z6826 Body mass index (BMI) 26.0-26.9, adult: Secondary | ICD-10-CM | POA: Diagnosis not present

## 2016-12-03 DIAGNOSIS — E10319 Type 1 diabetes mellitus with unspecified diabetic retinopathy without macular edema: Secondary | ICD-10-CM | POA: Diagnosis not present

## 2016-12-11 DIAGNOSIS — E109 Type 1 diabetes mellitus without complications: Secondary | ICD-10-CM | POA: Diagnosis not present

## 2016-12-19 ENCOUNTER — Other Ambulatory Visit (HOSPITAL_BASED_OUTPATIENT_CLINIC_OR_DEPARTMENT_OTHER): Payer: Self-pay | Admitting: Internal Medicine

## 2016-12-19 ENCOUNTER — Ambulatory Visit (HOSPITAL_BASED_OUTPATIENT_CLINIC_OR_DEPARTMENT_OTHER)
Admission: RE | Admit: 2016-12-19 | Discharge: 2016-12-19 | Disposition: A | Discharge status: Home / Self Care | Payer: 59 | Source: Ambulatory Visit | Attending: Internal Medicine | Admitting: Internal Medicine

## 2016-12-19 DIAGNOSIS — R05 Cough: Secondary | ICD-10-CM | POA: Diagnosis not present

## 2016-12-19 DIAGNOSIS — R059 Cough, unspecified: Secondary | ICD-10-CM

## 2016-12-19 DIAGNOSIS — R918 Other nonspecific abnormal finding of lung field: Secondary | ICD-10-CM | POA: Diagnosis not present

## 2016-12-25 MED FILL — CONTOUR NEXT STRIPS: 30 days supply | Qty: 300 | Fill #6

## 2016-12-25 MED FILL — LEVOTHYROXINE 175 MCG TAB: 175 | 30 days supply | Qty: 30 | Fill #5

## 2016-12-25 MED FILL — ROSUVASTATIN CALCIUM 10 MG: 10 | 37 days supply | Qty: 15 | Fill #3

## 2016-12-31 ENCOUNTER — Other Ambulatory Visit: Payer: Self-pay | Admitting: Podiatry

## 2016-12-31 ENCOUNTER — Ambulatory Visit (INDEPENDENT_AMBULATORY_CARE_PROVIDER_SITE_OTHER): Payer: 59 | Admitting: Podiatry

## 2016-12-31 ENCOUNTER — Ambulatory Visit (INDEPENDENT_AMBULATORY_CARE_PROVIDER_SITE_OTHER): Payer: 59

## 2016-12-31 ENCOUNTER — Telehealth: Payer: Self-pay

## 2016-12-31 ENCOUNTER — Encounter: Payer: Self-pay | Admitting: Podiatry

## 2016-12-31 DIAGNOSIS — R52 Pain, unspecified: Secondary | ICD-10-CM

## 2016-12-31 DIAGNOSIS — M2010 Hallux valgus (acquired), unspecified foot: Secondary | ICD-10-CM | POA: Diagnosis not present

## 2016-12-31 DIAGNOSIS — M722 Plantar fascial fibromatosis: Secondary | ICD-10-CM

## 2016-12-31 MED ORDER — MELOXICAM 15 MG PO TABS: 15.0000 mg | ORAL_TABLET | Freq: Every day | ORAL | 0 refills | Status: DC

## 2016-12-31 MED FILL — MELOXICAM 15 MG TABLET: 15 | 30 days supply | Qty: 30 | Fill #0

## 2016-12-31 NOTE — Telephone Encounter (Signed)
error 

## 2016-12-31 NOTE — Addendum Note (Signed)
Addended byDeidre Ala, Shyia Fillingim L on: 12/31/2016 12:08 PM   Modules accepted: Orders

## 2016-12-31 NOTE — Progress Notes (Signed)
   Subjective:    Patient ID: Miranda Shaw, female    DOB: [birth year], 61 y.o.   MRN: [id number]  HPI this patient presents the office with chief complaint of pain noted in the bottom of both of her feet.  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  She points to the area in the center of the arch on both feet as the site of pain.  This patient is diabetic and taking insulin.  She says the pain has been present for 2 years and she has provided no self treatment nor sought any professional help.  She does relate that she had knee problems for which she took ibuprofen which helped to reduce the pain in her foot.  She presents the office today with no history of trauma or injury to the foot.  She presents the office for evaluation and treatment of her painful feet.    Review of Systems  Endocrine:       Diabetes  Musculoskeletal:       Joint pain       Objective:   Physical Exam GENERAL APPEARANCE: Alert, conversant. Appropriately groomed. No acute distress.  VASCULAR: Pedal pulses are  palpable at  [facility] and PT bilateral.  Capillary refill time is immediate to all digits,  Normal temperature gradient.  Digital hair growth is present bilateral  NEUROLOGIC: sensation is normal to 5.07 monofilament at 5/5 sites bilateral.  Light touch is intact bilateral, Muscle strength normal.  MUSCULOSKELETAL: acceptable muscle strength, tone and stability bilateral.  Intrinsic muscluature intact bilateral.  Asymptomatic  HAV  B/L.  Palpable pain in the center of her arches both feet.  DERMATOLOGIC: skin color, texture, and turgor are within normal limits.  No preulcerative lesions or ulcers  are seen, no interdigital maceration noted.  No open lesions present.  Digital nails are asymptomatic. No drainage noted.         Assessment & Plan:  Plantar fasciitis  B/L  IE  X-rays taken do reveal the presence of the dorsomedial exostosis with a mild HAV deformity.  There is  minimal calcification at the insertion of the plantar fascia.  Discussed plantar fasciitis with this patient and we chose to treat her with power step insoles and prescribed Mobic to be taken daily.  Return to the clinic in 4 weeks for reevaluation and possible orthotic scheduling.   Gardiner Barefoot DPM

## 2017-01-16 DIAGNOSIS — M25561 Pain in right knee: Secondary | ICD-10-CM | POA: Diagnosis not present

## 2017-01-22 DIAGNOSIS — E109 Type 1 diabetes mellitus without complications: Secondary | ICD-10-CM | POA: Diagnosis not present

## 2017-01-27 DIAGNOSIS — E109 Type 1 diabetes mellitus without complications: Secondary | ICD-10-CM | POA: Diagnosis not present

## 2017-01-30 ENCOUNTER — Ambulatory Visit (INDEPENDENT_AMBULATORY_CARE_PROVIDER_SITE_OTHER): Payer: 59 | Admitting: Podiatry

## 2017-01-30 ENCOUNTER — Encounter: Payer: Self-pay | Admitting: Podiatry

## 2017-01-30 DIAGNOSIS — M722 Plantar fascial fibromatosis: Secondary | ICD-10-CM | POA: Diagnosis not present

## 2017-01-30 NOTE — Progress Notes (Signed)
This patient presents the office follow-up for diagnosis of plantar fascitis  Bilateral.  She was treated with power step insoles in her shoes and prescribed Mobic to be taken daily.  She says her feet are doing much better from her initial visit and her pain level is 2 out of 10 at today's visit.  She is very pleased with her feet improvement.  She also says that she has no knee pain taking the medicine and wearing the insoles.  She is very pleased with her progress.   General Appearance  Alert, conversant and in no acute stress.  Vascular  Dorsalis pedis and posterior pulses are palpable  bilaterally.  Capillary return is within normal limits  Bilaterally. Temperature is within normal limits  Bilaterally  Neurologic  Senn-Weinstein monofilament wire test within normal limits  bilaterally. Muscle power  Within normal limits bilaterally.  Nails normal. Troponin nails with no evidence of bacterial or fungal infection  Orthopedic  No limitations of motion of motion feet bilaterally.  No crepitus or effusions noted.  No bony pathology or digital deformities noted. Asymptomatic  HAV  B/L. no pain noted upon palpation of the plantar fascia on both feet.  Skin  normotropic skin with no porokeratosis noted bilaterally.  No signs of infections or ulcers noted.    Plantar fascitis  B/L  ROV.  Patient says she is very pleased with her improvement.  She says the insoles have done well since she stopped taking the Mobic one week ago the pain has continued to lessen.  I discussed the power steps versus receiving a new pair of custom molded orthotics.  Patient to return to the office in the future if she desires the orthotics   Gardiner Barefoot DPM

## 2017-02-16 MED FILL — ROSUVASTATIN CALCIUM 10 MG: 10 | 35 days supply | Qty: 15 | Fill #4

## 2017-02-17 ENCOUNTER — Other Ambulatory Visit: Payer: Self-pay | Admitting: Podiatry

## 2017-02-17 MED FILL — HumaLOG 100 UNIT/ML SOLN: 100 | 80 days supply | Qty: 40 | Fill #2

## 2017-02-17 MED FILL — LEVOTHYROXINE 175 MCG TAB: 175 | 30 days supply | Qty: 30 | Fill #0

## 2017-03-03 DIAGNOSIS — Z6826 Body mass index (BMI) 26.0-26.9, adult: Secondary | ICD-10-CM | POA: Diagnosis not present

## 2017-03-03 DIAGNOSIS — Z4681 Encounter for fitting and adjustment of insulin pump: Secondary | ICD-10-CM | POA: Diagnosis not present

## 2017-03-03 DIAGNOSIS — Z794 Long term (current) use of insulin: Secondary | ICD-10-CM | POA: Diagnosis not present

## 2017-03-03 DIAGNOSIS — E10319 Type 1 diabetes mellitus with unspecified diabetic retinopathy without macular edema: Secondary | ICD-10-CM | POA: Diagnosis not present

## 2017-03-04 DIAGNOSIS — H52203 Unspecified astigmatism, bilateral: Secondary | ICD-10-CM | POA: Diagnosis not present

## 2017-03-10 ENCOUNTER — Other Ambulatory Visit (HOSPITAL_BASED_OUTPATIENT_CLINIC_OR_DEPARTMENT_OTHER): Payer: Self-pay | Admitting: Obstetrics & Gynecology

## 2017-03-10 DIAGNOSIS — Z1239 Encounter for other screening for malignant neoplasm of breast: Secondary | ICD-10-CM

## 2017-03-11 MED FILL — CONTOUR NEXT STRIPS: 30 days supply | Qty: 300 | Fill #7

## 2017-03-12 ENCOUNTER — Other Ambulatory Visit (HOSPITAL_BASED_OUTPATIENT_CLINIC_OR_DEPARTMENT_OTHER): Payer: Self-pay | Admitting: Endocrinology

## 2017-03-12 ENCOUNTER — Ambulatory Visit (HOSPITAL_BASED_OUTPATIENT_CLINIC_OR_DEPARTMENT_OTHER)
Admission: RE | Admit: 2017-03-12 | Discharge: 2017-03-12 | Disposition: A | Discharge status: Home / Self Care | Payer: 59 | Source: Ambulatory Visit | Attending: Endocrinology | Admitting: Endocrinology

## 2017-03-12 DIAGNOSIS — F320 Major depressive disorder, single episode, mild: Secondary | ICD-10-CM | POA: Diagnosis not present

## 2017-03-12 DIAGNOSIS — E11319 Type 2 diabetes mellitus with unspecified diabetic retinopathy without macular edema: Secondary | ICD-10-CM | POA: Diagnosis not present

## 2017-03-12 DIAGNOSIS — E559 Vitamin D deficiency, unspecified: Secondary | ICD-10-CM | POA: Diagnosis not present

## 2017-03-12 DIAGNOSIS — J1000 Influenza due to other identified influenza virus with unspecified type of pneumonia: Secondary | ICD-10-CM | POA: Diagnosis not present

## 2017-03-12 DIAGNOSIS — M810 Age-related osteoporosis without current pathological fracture: Secondary | ICD-10-CM | POA: Diagnosis not present

## 2017-03-12 DIAGNOSIS — Z6826 Body mass index (BMI) 26.0-26.9, adult: Secondary | ICD-10-CM | POA: Diagnosis not present

## 2017-03-12 DIAGNOSIS — E038 Other specified hypothyroidism: Secondary | ICD-10-CM | POA: Diagnosis not present

## 2017-03-12 DIAGNOSIS — E7849 Other hyperlipidemia: Secondary | ICD-10-CM | POA: Diagnosis not present

## 2017-03-12 DIAGNOSIS — E10319 Type 1 diabetes mellitus with unspecified diabetic retinopathy without macular edema: Secondary | ICD-10-CM | POA: Diagnosis not present

## 2017-03-12 DIAGNOSIS — J189 Pneumonia, unspecified organism: Secondary | ICD-10-CM | POA: Diagnosis not present

## 2017-03-13 ENCOUNTER — Ambulatory Visit (HOSPITAL_BASED_OUTPATIENT_CLINIC_OR_DEPARTMENT_OTHER)
Admission: RE | Admit: 2017-03-13 | Discharge: 2017-03-13 | Disposition: A | Discharge status: Home / Self Care | Payer: 59 | Source: Ambulatory Visit | Attending: Obstetrics & Gynecology | Admitting: Obstetrics & Gynecology

## 2017-03-13 DIAGNOSIS — Z1231 Encounter for screening mammogram for malignant neoplasm of breast: Secondary | ICD-10-CM | POA: Insufficient documentation

## 2017-03-13 DIAGNOSIS — Z1239 Encounter for other screening for malignant neoplasm of breast: Secondary | ICD-10-CM

## 2017-03-13 DIAGNOSIS — E109 Type 1 diabetes mellitus without complications: Secondary | ICD-10-CM | POA: Diagnosis not present

## 2017-03-13 MED FILL — LEVOTHYROXINE 200 MCG TAB: 200 | 90 days supply | Qty: 96 | Fill #0

## 2017-05-06 DIAGNOSIS — E109 Type 1 diabetes mellitus without complications: Secondary | ICD-10-CM | POA: Diagnosis not present

## 2017-05-11 MED FILL — CONTOUR NEXT STRIPS: 30 days supply | Qty: 300 | Fill #8

## 2017-05-11 MED FILL — ROSUVASTATIN CALCIUM 10 MG: 10 | 35 days supply | Qty: 15 | Fill #5

## 2017-05-11 MED FILL — HumaLOG 100 UNIT/ML SOLN: 100 | 80 days supply | Qty: 40 | Fill #3

## 2017-06-03 DIAGNOSIS — Z794 Long term (current) use of insulin: Secondary | ICD-10-CM | POA: Diagnosis not present

## 2017-06-03 DIAGNOSIS — Z6826 Body mass index (BMI) 26.0-26.9, adult: Secondary | ICD-10-CM | POA: Diagnosis not present

## 2017-06-03 DIAGNOSIS — Z4681 Encounter for fitting and adjustment of insulin pump: Secondary | ICD-10-CM | POA: Diagnosis not present

## 2017-06-03 DIAGNOSIS — E10319 Type 1 diabetes mellitus with unspecified diabetic retinopathy without macular edema: Secondary | ICD-10-CM | POA: Diagnosis not present

## 2017-06-11 DIAGNOSIS — E109 Type 1 diabetes mellitus without complications: Secondary | ICD-10-CM | POA: Diagnosis not present

## 2017-07-07 DIAGNOSIS — M810 Age-related osteoporosis without current pathological fracture: Secondary | ICD-10-CM | POA: Diagnosis not present

## 2017-07-07 DIAGNOSIS — E559 Vitamin D deficiency, unspecified: Secondary | ICD-10-CM | POA: Diagnosis not present

## 2017-07-07 DIAGNOSIS — Z1389 Encounter for screening for other disorder: Secondary | ICD-10-CM | POA: Diagnosis not present

## 2017-07-07 DIAGNOSIS — E10319 Type 1 diabetes mellitus with unspecified diabetic retinopathy without macular edema: Secondary | ICD-10-CM | POA: Diagnosis not present

## 2017-07-07 DIAGNOSIS — Z794 Long term (current) use of insulin: Secondary | ICD-10-CM | POA: Diagnosis not present

## 2017-07-07 DIAGNOSIS — E785 Hyperlipidemia, unspecified: Secondary | ICD-10-CM | POA: Diagnosis not present

## 2017-07-07 DIAGNOSIS — E11319 Type 2 diabetes mellitus with unspecified diabetic retinopathy without macular edema: Secondary | ICD-10-CM | POA: Diagnosis not present

## 2017-07-07 DIAGNOSIS — M722 Plantar fascial fibromatosis: Secondary | ICD-10-CM | POA: Diagnosis not present

## 2017-07-07 DIAGNOSIS — E038 Other specified hypothyroidism: Secondary | ICD-10-CM | POA: Diagnosis not present

## 2017-07-29 DIAGNOSIS — Z01419 Encounter for gynecological examination (general) (routine) without abnormal findings: Secondary | ICD-10-CM | POA: Diagnosis not present

## 2017-07-29 DIAGNOSIS — Z6827 Body mass index (BMI) 27.0-27.9, adult: Secondary | ICD-10-CM | POA: Diagnosis not present

## 2017-07-30 DIAGNOSIS — E109 Type 1 diabetes mellitus without complications: Secondary | ICD-10-CM | POA: Diagnosis not present

## 2017-07-30 MED FILL — HumaLOG 100 UNIT/ML SOLN: 100 | 40 days supply | Qty: 20 | Fill #4

## 2017-07-30 MED FILL — CONTOUR NEXT STRIPS: 30 days supply | Qty: 300 | Fill #0

## 2017-07-30 MED FILL — ROSUVASTATIN CALCIUM 10 MG: 10 | 35 days supply | Qty: 15 | Fill #0

## 2017-08-04 ENCOUNTER — Other Ambulatory Visit (HOSPITAL_BASED_OUTPATIENT_CLINIC_OR_DEPARTMENT_OTHER): Payer: Self-pay | Admitting: Obstetrics and Gynecology

## 2017-08-04 DIAGNOSIS — Z780 Asymptomatic menopausal state: Secondary | ICD-10-CM

## 2017-08-05 ENCOUNTER — Ambulatory Visit (HOSPITAL_BASED_OUTPATIENT_CLINIC_OR_DEPARTMENT_OTHER)
Admission: RE | Admit: 2017-08-05 | Discharge: 2017-08-05 | Disposition: A | Discharge status: Home / Self Care | Payer: 59 | Source: Ambulatory Visit | Attending: Obstetrics and Gynecology | Admitting: Obstetrics and Gynecology

## 2017-08-05 DIAGNOSIS — E109 Type 1 diabetes mellitus without complications: Secondary | ICD-10-CM | POA: Diagnosis not present

## 2017-08-05 DIAGNOSIS — Z780 Asymptomatic menopausal state: Secondary | ICD-10-CM | POA: Insufficient documentation

## 2017-08-05 DIAGNOSIS — M8589 Other specified disorders of bone density and structure, multiple sites: Secondary | ICD-10-CM | POA: Diagnosis not present

## 2017-08-05 DIAGNOSIS — M8588 Other specified disorders of bone density and structure, other site: Secondary | ICD-10-CM | POA: Insufficient documentation

## 2017-08-19 MED FILL — LEVOTHYROXINE 200 MCG TAB: 200 | 90 days supply | Qty: 96 | Fill #1

## 2017-08-24 MED FILL — CONTOUR NEXT STRIPS: 30 days supply | Qty: 300 | Fill #1

## 2017-09-02 DIAGNOSIS — Z4681 Encounter for fitting and adjustment of insulin pump: Secondary | ICD-10-CM | POA: Diagnosis not present

## 2017-09-02 DIAGNOSIS — Z6826 Body mass index (BMI) 26.0-26.9, adult: Secondary | ICD-10-CM | POA: Diagnosis not present

## 2017-09-02 DIAGNOSIS — E10319 Type 1 diabetes mellitus with unspecified diabetic retinopathy without macular edema: Secondary | ICD-10-CM | POA: Diagnosis not present

## 2017-09-02 DIAGNOSIS — Z794 Long term (current) use of insulin: Secondary | ICD-10-CM | POA: Diagnosis not present

## 2017-09-09 DIAGNOSIS — E109 Type 1 diabetes mellitus without complications: Secondary | ICD-10-CM | POA: Diagnosis not present

## 2017-10-23 DIAGNOSIS — E109 Type 1 diabetes mellitus without complications: Secondary | ICD-10-CM | POA: Diagnosis not present

## 2017-11-02 DIAGNOSIS — E109 Type 1 diabetes mellitus without complications: Secondary | ICD-10-CM | POA: Diagnosis not present

## 2017-11-13 DIAGNOSIS — E038 Other specified hypothyroidism: Secondary | ICD-10-CM | POA: Diagnosis not present

## 2017-11-13 DIAGNOSIS — E559 Vitamin D deficiency, unspecified: Secondary | ICD-10-CM | POA: Diagnosis not present

## 2017-11-13 DIAGNOSIS — N183 Chronic kidney disease, stage 3 (moderate): Secondary | ICD-10-CM | POA: Diagnosis not present

## 2017-11-13 DIAGNOSIS — Z6826 Body mass index (BMI) 26.0-26.9, adult: Secondary | ICD-10-CM | POA: Diagnosis not present

## 2017-11-13 DIAGNOSIS — M810 Age-related osteoporosis without current pathological fracture: Secondary | ICD-10-CM | POA: Diagnosis not present

## 2017-11-13 DIAGNOSIS — E11319 Type 2 diabetes mellitus with unspecified diabetic retinopathy without macular edema: Secondary | ICD-10-CM | POA: Diagnosis not present

## 2017-11-13 DIAGNOSIS — E7849 Other hyperlipidemia: Secondary | ICD-10-CM | POA: Diagnosis not present

## 2017-11-13 DIAGNOSIS — Z794 Long term (current) use of insulin: Secondary | ICD-10-CM | POA: Diagnosis not present

## 2017-11-13 DIAGNOSIS — E109 Type 1 diabetes mellitus without complications: Secondary | ICD-10-CM | POA: Diagnosis not present

## 2017-11-13 DIAGNOSIS — E10319 Type 1 diabetes mellitus with unspecified diabetic retinopathy without macular edema: Secondary | ICD-10-CM | POA: Diagnosis not present

## 2017-11-13 MED FILL — LEVOTHYROXINE 200 MCG TAB: 200 | 90 days supply | Qty: 96 | Fill #2

## 2017-11-13 MED FILL — ROSUVASTATIN CALCIUM 10 MG: 10 | 35 days supply | Qty: 15 | Fill #1

## 2017-11-13 MED FILL — HumaLOG 100 UNIT/ML SOLN: 100 | 80 days supply | Qty: 40 | Fill #0

## 2017-12-02 DIAGNOSIS — N183 Chronic kidney disease, stage 3 (moderate): Secondary | ICD-10-CM | POA: Diagnosis not present

## 2017-12-02 DIAGNOSIS — E10319 Type 1 diabetes mellitus with unspecified diabetic retinopathy without macular edema: Secondary | ICD-10-CM | POA: Diagnosis not present

## 2017-12-02 DIAGNOSIS — Z4681 Encounter for fitting and adjustment of insulin pump: Secondary | ICD-10-CM | POA: Diagnosis not present

## 2017-12-02 DIAGNOSIS — E559 Vitamin D deficiency, unspecified: Secondary | ICD-10-CM | POA: Diagnosis not present

## 2017-12-02 DIAGNOSIS — E7849 Other hyperlipidemia: Secondary | ICD-10-CM | POA: Diagnosis not present

## 2017-12-02 DIAGNOSIS — Z794 Long term (current) use of insulin: Secondary | ICD-10-CM | POA: Diagnosis not present

## 2017-12-03 MED FILL — EZETIMIBE 10 MG TABLET: 10 | 30 days supply | Qty: 30 | Fill #0

## 2017-12-08 DIAGNOSIS — E109 Type 1 diabetes mellitus without complications: Secondary | ICD-10-CM | POA: Diagnosis not present

## 2017-12-11 DIAGNOSIS — E109 Type 1 diabetes mellitus without complications: Secondary | ICD-10-CM | POA: Diagnosis not present

## 2018-02-01 DIAGNOSIS — E109 Type 1 diabetes mellitus without complications: Secondary | ICD-10-CM | POA: Diagnosis not present

## 2018-02-11 DIAGNOSIS — E109 Type 1 diabetes mellitus without complications: Secondary | ICD-10-CM | POA: Diagnosis not present

## 2018-02-15 MED FILL — HumaLOG 100 UNIT/ML SOLN: 100 | 80 days supply | Qty: 40 | Fill #1

## 2018-02-15 MED FILL — EZETIMIBE 10 MG TABLET: 10 | 30 days supply | Qty: 30 | Fill #1

## 2018-02-15 MED FILL — LEVOTHYROXINE 200 MCG TAB: 200 | 90 days supply | Qty: 96 | Fill #3

## 2018-02-15 MED FILL — ROSUVASTATIN CALCIUM 10 MG: 10 | 35 days supply | Qty: 15 | Fill #2

## 2018-03-02 DIAGNOSIS — E109 Type 1 diabetes mellitus without complications: Secondary | ICD-10-CM | POA: Diagnosis not present

## 2018-03-04 DIAGNOSIS — H52203 Unspecified astigmatism, bilateral: Secondary | ICD-10-CM | POA: Diagnosis not present

## 2018-03-08 DIAGNOSIS — E109 Type 1 diabetes mellitus without complications: Secondary | ICD-10-CM | POA: Diagnosis not present

## 2018-03-16 DIAGNOSIS — E038 Other specified hypothyroidism: Secondary | ICD-10-CM | POA: Diagnosis not present

## 2018-03-16 DIAGNOSIS — E10319 Type 1 diabetes mellitus with unspecified diabetic retinopathy without macular edema: Secondary | ICD-10-CM | POA: Diagnosis not present

## 2018-03-16 DIAGNOSIS — M810 Age-related osteoporosis without current pathological fracture: Secondary | ICD-10-CM | POA: Diagnosis not present

## 2018-03-16 DIAGNOSIS — N183 Chronic kidney disease, stage 3 (moderate): Secondary | ICD-10-CM | POA: Diagnosis not present

## 2018-03-16 DIAGNOSIS — E559 Vitamin D deficiency, unspecified: Secondary | ICD-10-CM | POA: Diagnosis not present

## 2018-03-16 DIAGNOSIS — Z794 Long term (current) use of insulin: Secondary | ICD-10-CM | POA: Diagnosis not present

## 2018-03-16 DIAGNOSIS — Z6825 Body mass index (BMI) 25.0-25.9, adult: Secondary | ICD-10-CM | POA: Diagnosis not present

## 2018-03-16 DIAGNOSIS — E7849 Other hyperlipidemia: Secondary | ICD-10-CM | POA: Diagnosis not present

## 2018-04-16 MED FILL — ROSUVASTATIN CALCIUM 10 MG: 10 | 35 days supply | Qty: 15 | Fill #3

## 2018-04-16 MED FILL — EZETIMIBE 10 MG TABS: 10 | 30 days supply | Qty: 30 | Fill #2

## 2018-04-21 MED FILL — HumaLOG 100 UNIT/ML SOLN: 100 | 90 days supply | Qty: 90 | Fill #0

## 2018-04-22 DIAGNOSIS — E109 Type 1 diabetes mellitus without complications: Secondary | ICD-10-CM | POA: Diagnosis not present

## 2018-04-27 DIAGNOSIS — Z4681 Encounter for fitting and adjustment of insulin pump: Secondary | ICD-10-CM | POA: Diagnosis not present

## 2018-04-27 DIAGNOSIS — Z6825 Body mass index (BMI) 25.0-25.9, adult: Secondary | ICD-10-CM | POA: Diagnosis not present

## 2018-04-27 DIAGNOSIS — N183 Chronic kidney disease, stage 3 (moderate): Secondary | ICD-10-CM | POA: Diagnosis not present

## 2018-04-27 DIAGNOSIS — E10319 Type 1 diabetes mellitus with unspecified diabetic retinopathy without macular edema: Secondary | ICD-10-CM | POA: Diagnosis not present

## 2018-04-27 DIAGNOSIS — Z794 Long term (current) use of insulin: Secondary | ICD-10-CM | POA: Diagnosis not present

## 2018-05-12 DIAGNOSIS — E109 Type 1 diabetes mellitus without complications: Secondary | ICD-10-CM | POA: Diagnosis not present

## 2018-05-24 DIAGNOSIS — E109 Type 1 diabetes mellitus without complications: Secondary | ICD-10-CM | POA: Diagnosis not present

## 2018-05-25 ENCOUNTER — Other Ambulatory Visit (HOSPITAL_BASED_OUTPATIENT_CLINIC_OR_DEPARTMENT_OTHER): Payer: Self-pay | Admitting: Obstetrics & Gynecology

## 2018-05-25 DIAGNOSIS — Z1239 Encounter for other screening for malignant neoplasm of breast: Secondary | ICD-10-CM

## 2018-05-28 ENCOUNTER — Ambulatory Visit (HOSPITAL_BASED_OUTPATIENT_CLINIC_OR_DEPARTMENT_OTHER)
Admission: RE | Admit: 2018-05-28 | Discharge: 2018-05-28 | Disposition: A | Discharge status: Home / Self Care | Payer: 59 | Source: Ambulatory Visit | Attending: Obstetrics & Gynecology | Admitting: Obstetrics & Gynecology

## 2018-05-28 DIAGNOSIS — Z1231 Encounter for screening mammogram for malignant neoplasm of breast: Secondary | ICD-10-CM | POA: Insufficient documentation

## 2018-05-28 DIAGNOSIS — Z1239 Encounter for other screening for malignant neoplasm of breast: Secondary | ICD-10-CM

## 2018-05-31 ENCOUNTER — Other Ambulatory Visit: Payer: Self-pay | Admitting: Obstetrics & Gynecology

## 2018-05-31 DIAGNOSIS — R928 Other abnormal and inconclusive findings on diagnostic imaging of breast: Secondary | ICD-10-CM

## 2018-06-03 MED FILL — EZETIMIBE 10 MG TABS: 10 | 30 days supply | Qty: 30 | Fill #3

## 2018-06-03 MED FILL — LEVOTHYROXINE 200 MCG TAB: 200 | 84 days supply | Qty: 84 | Fill #0

## 2018-06-04 DIAGNOSIS — E109 Type 1 diabetes mellitus without complications: Secondary | ICD-10-CM | POA: Diagnosis not present

## 2018-06-08 ENCOUNTER — Ambulatory Visit
Admission: RE | Admit: 2018-06-08 | Discharge: 2018-06-08 | Disposition: A | Discharge status: Home / Self Care | Payer: 59 | Source: Ambulatory Visit | Attending: Obstetrics & Gynecology | Admitting: Obstetrics & Gynecology

## 2018-06-08 DIAGNOSIS — R928 Other abnormal and inconclusive findings on diagnostic imaging of breast: Secondary | ICD-10-CM

## 2018-06-08 DIAGNOSIS — N6489 Other specified disorders of breast: Secondary | ICD-10-CM | POA: Diagnosis not present

## 2018-06-09 DIAGNOSIS — M25511 Pain in right shoulder: Secondary | ICD-10-CM | POA: Diagnosis not present

## 2018-06-14 MED FILL — ROSUVASTATIN CALCIUM 10 MG: 10 | 35 days supply | Qty: 15 | Fill #4

## 2018-07-07 DIAGNOSIS — M25511 Pain in right shoulder: Secondary | ICD-10-CM | POA: Diagnosis not present

## 2018-07-21 DIAGNOSIS — E109 Type 1 diabetes mellitus without complications: Secondary | ICD-10-CM | POA: Diagnosis not present

## 2018-07-29 MED FILL — ROSUVASTATIN CALCIUM 10 MG: 10 | 35 days supply | Qty: 15 | Fill #0

## 2018-07-29 MED FILL — HumaLOG 100 UNIT/ML SOLN: 100 | 90 days supply | Qty: 90 | Fill #1

## 2018-07-29 MED FILL — EZETIMIBE 10 MG TABS: 10 | 30 days supply | Qty: 30 | Fill #4

## 2018-08-10 DIAGNOSIS — E109 Type 1 diabetes mellitus without complications: Secondary | ICD-10-CM | POA: Diagnosis not present

## 2018-08-11 DIAGNOSIS — M25511 Pain in right shoulder: Secondary | ICD-10-CM | POA: Diagnosis not present

## 2018-08-12 ENCOUNTER — Other Ambulatory Visit: Payer: Self-pay | Admitting: Orthopaedic Surgery

## 2018-08-12 DIAGNOSIS — M25511 Pain in right shoulder: Secondary | ICD-10-CM

## 2018-08-12 DIAGNOSIS — R229 Localized swelling, mass and lump, unspecified: Principal | ICD-10-CM

## 2018-08-12 DIAGNOSIS — IMO0002 Reserved for concepts with insufficient information to code with codable children: Secondary | ICD-10-CM

## 2018-08-12 DIAGNOSIS — G8929 Other chronic pain: Secondary | ICD-10-CM

## 2018-08-13 DIAGNOSIS — M25511 Pain in right shoulder: Secondary | ICD-10-CM | POA: Diagnosis not present

## 2018-08-16 ENCOUNTER — Other Ambulatory Visit: Payer: Self-pay | Admitting: Orthopaedic Surgery

## 2018-08-16 ENCOUNTER — Other Ambulatory Visit: Payer: Self-pay

## 2018-08-16 ENCOUNTER — Ambulatory Visit (INDEPENDENT_AMBULATORY_CARE_PROVIDER_SITE_OTHER): Payer: 59

## 2018-08-16 DIAGNOSIS — M25511 Pain in right shoulder: Secondary | ICD-10-CM | POA: Diagnosis not present

## 2018-08-16 DIAGNOSIS — G8929 Other chronic pain: Secondary | ICD-10-CM | POA: Diagnosis not present

## 2018-08-16 DIAGNOSIS — IMO0002 Reserved for concepts with insufficient information to code with codable children: Secondary | ICD-10-CM

## 2018-08-16 DIAGNOSIS — R229 Localized swelling, mass and lump, unspecified: Principal | ICD-10-CM

## 2018-08-17 ENCOUNTER — Other Ambulatory Visit: Payer: Self-pay | Admitting: Orthopaedic Surgery

## 2018-08-17 DIAGNOSIS — M25511 Pain in right shoulder: Secondary | ICD-10-CM

## 2018-08-23 ENCOUNTER — Other Ambulatory Visit: Payer: Self-pay

## 2018-08-23 ENCOUNTER — Ambulatory Visit (INDEPENDENT_AMBULATORY_CARE_PROVIDER_SITE_OTHER): Payer: 59

## 2018-08-23 DIAGNOSIS — M25511 Pain in right shoulder: Secondary | ICD-10-CM | POA: Diagnosis not present

## 2018-08-23 DIAGNOSIS — M25411 Effusion, right shoulder: Secondary | ICD-10-CM | POA: Diagnosis not present

## 2018-08-23 MED ORDER — GADOBUTROL 1 MMOL/ML IV SOLN
7.0000 mL | Freq: Once | INTRAVENOUS | Status: AC | PRN
  Administered 2018-08-23: 7 mL via INTRAVENOUS

## 2018-08-25 DIAGNOSIS — M25511 Pain in right shoulder: Secondary | ICD-10-CM | POA: Diagnosis not present

## 2018-08-30 DIAGNOSIS — E109 Type 1 diabetes mellitus without complications: Secondary | ICD-10-CM | POA: Diagnosis not present

## 2018-08-30 MED FILL — LEVOTHYROXINE 200 MCG TAB: 200 | 84 days supply | Qty: 84 | Fill #1

## 2018-08-30 MED FILL — ROSUVASTATIN CALCIUM 10 MG: 10 | 35 days supply | Qty: 15 | Fill #1

## 2018-08-30 MED FILL — EZETIMIBE 10 MG TABS: 10 | 30 days supply | Qty: 30 | Fill #5

## 2018-09-08 ENCOUNTER — Other Ambulatory Visit: Payer: Self-pay | Admitting: Orthopaedic Surgery

## 2018-09-13 ENCOUNTER — Encounter (HOSPITAL_BASED_OUTPATIENT_CLINIC_OR_DEPARTMENT_OTHER): Payer: Self-pay | Admitting: *Deleted

## 2018-09-13 ENCOUNTER — Other Ambulatory Visit: Payer: Self-pay

## 2018-09-14 ENCOUNTER — Encounter (HOSPITAL_BASED_OUTPATIENT_CLINIC_OR_DEPARTMENT_OTHER): Payer: Self-pay

## 2018-09-14 ENCOUNTER — Other Ambulatory Visit: Payer: Self-pay

## 2018-09-14 ENCOUNTER — Ambulatory Visit (HOSPITAL_BASED_OUTPATIENT_CLINIC_OR_DEPARTMENT_OTHER): Admit: 2018-09-14 | Payer: 59 | Admitting: Orthopaedic Surgery

## 2018-09-14 ENCOUNTER — Encounter (HOSPITAL_BASED_OUTPATIENT_CLINIC_OR_DEPARTMENT_OTHER)
Admission: RE | Admit: 2018-09-14 | Discharge: 2018-09-14 | Disposition: A | Discharge status: Home / Self Care | Payer: 59 | Source: Ambulatory Visit | Attending: Orthopaedic Surgery | Admitting: Orthopaedic Surgery

## 2018-09-14 ENCOUNTER — Other Ambulatory Visit (HOSPITAL_COMMUNITY)
Admission: RE | Admit: 2018-09-14 | Discharge: 2018-09-14 | Disposition: A | Discharge status: Home / Self Care | Payer: 59 | Source: Ambulatory Visit | Attending: Orthopaedic Surgery | Admitting: Orthopaedic Surgery

## 2018-09-14 DIAGNOSIS — Z79899 Other long term (current) drug therapy: Secondary | ICD-10-CM | POA: Diagnosis not present

## 2018-09-14 DIAGNOSIS — M25811 Other specified joint disorders, right shoulder: Secondary | ICD-10-CM | POA: Diagnosis not present

## 2018-09-14 DIAGNOSIS — M1990 Unspecified osteoarthritis, unspecified site: Secondary | ICD-10-CM | POA: Diagnosis not present

## 2018-09-14 DIAGNOSIS — M7501 Adhesive capsulitis of right shoulder: Secondary | ICD-10-CM | POA: Diagnosis not present

## 2018-09-14 DIAGNOSIS — Z79890 Hormone replacement therapy (postmenopausal): Secondary | ICD-10-CM | POA: Diagnosis not present

## 2018-09-14 DIAGNOSIS — E119 Type 2 diabetes mellitus without complications: Secondary | ICD-10-CM | POA: Diagnosis not present

## 2018-09-14 DIAGNOSIS — Z794 Long term (current) use of insulin: Secondary | ICD-10-CM | POA: Diagnosis not present

## 2018-09-14 DIAGNOSIS — Z1159 Encounter for screening for other viral diseases: Secondary | ICD-10-CM | POA: Diagnosis not present

## 2018-09-14 DIAGNOSIS — Z9641 Presence of insulin pump (external) (internal): Secondary | ICD-10-CM | POA: Diagnosis not present

## 2018-09-14 DIAGNOSIS — Z87891 Personal history of nicotine dependence: Secondary | ICD-10-CM | POA: Diagnosis not present

## 2018-09-14 DIAGNOSIS — E039 Hypothyroidism, unspecified: Secondary | ICD-10-CM | POA: Diagnosis not present

## 2018-09-14 LAB — BASIC METABOLIC PANEL
Anion gap: 10 (ref 5–15)
BUN: 11 mg/dL (ref 8–23)
CO2: 26 mmol/L (ref 22–32)
Calcium: 9.8 mg/dL (ref 8.9–10.3)
Chloride: 102 mmol/L (ref 98–111)
Creatinine, Ser: 0.78 mg/dL (ref 0.44–1.00)
GFR calc Af Amer: >60 mL/min (ref >60)
GFR calc non Af Amer: >60 mL/min (ref >60)
Glucose, Bld: 230 mg/dL — ABNORMAL HIGH (ref 70–99)
Potassium: 4.1 mmol/L (ref 3.5–5.1)
Sodium: 138 mmol/L (ref 135–145)

## 2018-09-14 SURGERY — ARTHROSCOPY, SHOULDER
Anesthesia: Choice | Laterality: Right

## 2018-09-14 NOTE — Progress Notes (Signed)
Pre-surgery drink (G2) given.  Patient verbalized understanding of when to drink.

## 2018-09-15 LAB — NOVEL CORONAVIRUS, NAA (HOSP ORDER, SEND-OUT TO REF LAB; TAT 18-24 HRS): SARS-CoV-2, NAA: NOT DETECTED

## 2018-09-15 MED FILL — HYDROCODON-APAP 5-325: 5-325 | 3 days supply | Qty: 20 | Fill #0

## 2018-09-17 ENCOUNTER — Other Ambulatory Visit: Payer: Self-pay

## 2018-09-17 ENCOUNTER — Ambulatory Visit (HOSPITAL_BASED_OUTPATIENT_CLINIC_OR_DEPARTMENT_OTHER): Payer: 59 | Admitting: Certified Registered"

## 2018-09-17 ENCOUNTER — Encounter (HOSPITAL_BASED_OUTPATIENT_CLINIC_OR_DEPARTMENT_OTHER)
Admission: RE | Disposition: A | Discharge status: Home / Self Care | Payer: Self-pay | Source: Home / Self Care | Attending: Orthopaedic Surgery

## 2018-09-17 ENCOUNTER — Ambulatory Visit (HOSPITAL_BASED_OUTPATIENT_CLINIC_OR_DEPARTMENT_OTHER)
Admission: RE | Admit: 2018-09-17 | Discharge: 2018-09-17 | Disposition: A | Discharge status: Home / Self Care | Payer: 59 | Attending: Orthopaedic Surgery | Admitting: Orthopaedic Surgery

## 2018-09-17 ENCOUNTER — Encounter (HOSPITAL_BASED_OUTPATIENT_CLINIC_OR_DEPARTMENT_OTHER): Payer: Self-pay | Admitting: Certified Registered"

## 2018-09-17 DIAGNOSIS — Z87891 Personal history of nicotine dependence: Secondary | ICD-10-CM | POA: Insufficient documentation

## 2018-09-17 DIAGNOSIS — M7501 Adhesive capsulitis of right shoulder: Secondary | ICD-10-CM | POA: Insufficient documentation

## 2018-09-17 DIAGNOSIS — Z9641 Presence of insulin pump (external) (internal): Secondary | ICD-10-CM | POA: Diagnosis not present

## 2018-09-17 DIAGNOSIS — M25811 Other specified joint disorders, right shoulder: Secondary | ICD-10-CM | POA: Insufficient documentation

## 2018-09-17 DIAGNOSIS — E039 Hypothyroidism, unspecified: Secondary | ICD-10-CM | POA: Diagnosis not present

## 2018-09-17 DIAGNOSIS — Z79890 Hormone replacement therapy (postmenopausal): Secondary | ICD-10-CM | POA: Diagnosis not present

## 2018-09-17 DIAGNOSIS — E119 Type 2 diabetes mellitus without complications: Secondary | ICD-10-CM | POA: Diagnosis not present

## 2018-09-17 DIAGNOSIS — Z794 Long term (current) use of insulin: Secondary | ICD-10-CM | POA: Insufficient documentation

## 2018-09-17 DIAGNOSIS — M7541 Impingement syndrome of right shoulder: Secondary | ICD-10-CM | POA: Diagnosis not present

## 2018-09-17 DIAGNOSIS — Z79899 Other long term (current) drug therapy: Secondary | ICD-10-CM | POA: Insufficient documentation

## 2018-09-17 DIAGNOSIS — M1990 Unspecified osteoarthritis, unspecified site: Secondary | ICD-10-CM | POA: Insufficient documentation

## 2018-09-17 HISTORY — DX: Impingement syndrome of right shoulder: M75.41

## 2018-09-17 HISTORY — PX: SHOULDER ARTHROSCOPY WITH SUBACROMIAL DECOMPRESSION: SHX5684

## 2018-09-17 HISTORY — DX: Hypothyroidism, unspecified: E03.9

## 2018-09-17 HISTORY — PX: SHOULDER CLOSED REDUCTION: SHX1051

## 2018-09-17 LAB — GLUCOSE, CAPILLARY
Glucose-Capillary: 145 mg/dL — ABNORMAL HIGH (ref 70–99)
Glucose-Capillary: 154 mg/dL — ABNORMAL HIGH (ref 70–99)

## 2018-09-17 SURGERY — SHOULDER ARTHROSCOPY WITH SUBACROMIAL DECOMPRESSION
Anesthesia: Regional | Site: Shoulder | Laterality: Right

## 2018-09-17 MED ORDER — ACETAMINOPHEN 500 MG PO TABS: 1000.0000 mg | ORAL_TABLET | Freq: Once | ORAL | Status: DC | PRN

## 2018-09-17 MED ORDER — CHLORHEXIDINE GLUCONATE 4 % EX LIQD: 60.0000 mL | Freq: Once | CUTANEOUS | Status: DC

## 2018-09-17 MED ORDER — PHENYLEPHRINE HCL (PRESSORS) 10 MG/ML IV SOLN
INTRAVENOUS | Status: DC | PRN
  Administered 2018-09-17: 80 ug via INTRAVENOUS

## 2018-09-17 MED ORDER — ACETAMINOPHEN 160 MG/5ML PO SOLN: 1000.0000 mg | Freq: Once | ORAL | Status: DC | PRN

## 2018-09-17 MED ORDER — FENTANYL CITRATE (PF) 100 MCG/2ML IJ SOLN
INTRAMUSCULAR | Status: AC
  Filled 2018-09-17: qty 2

## 2018-09-17 MED ORDER — PROPOFOL 500 MG/50ML IV EMUL
INTRAVENOUS | Status: DC | PRN
  Administered 2018-09-17: 25 ug/kg/min via INTRAVENOUS

## 2018-09-17 MED ORDER — CLONIDINE HCL (ANALGESIA) 100 MCG/ML EP SOLN
EPIDURAL | Status: DC | PRN
  Administered 2018-09-17: 100 ug

## 2018-09-17 MED ORDER — ACETAMINOPHEN 10 MG/ML IV SOLN: 1000.0000 mg | Freq: Once | INTRAVENOUS | Status: DC | PRN

## 2018-09-17 MED ORDER — DEXAMETHASONE SODIUM PHOSPHATE 4 MG/ML IJ SOLN
INTRAMUSCULAR | Status: DC | PRN
  Administered 2018-09-17: 4 mg via INTRAVENOUS

## 2018-09-17 MED ORDER — BUPIVACAINE HCL (PF) 0.5 % IJ SOLN
INTRAMUSCULAR | Status: DC | PRN
  Administered 2018-09-17: 15 mL via PERINEURAL

## 2018-09-17 MED ORDER — MIDAZOLAM HCL 2 MG/2ML IJ SOLN
1.0000 mg | INTRAMUSCULAR | Status: DC | PRN
  Administered 2018-09-17: 2 mg via INTRAVENOUS

## 2018-09-17 MED ORDER — CEFAZOLIN SODIUM-DEXTROSE 2-4 GM/100ML-% IV SOLN
2.0000 g | INTRAVENOUS | Status: AC
  Administered 2018-09-17: 2 g via INTRAVENOUS

## 2018-09-17 MED ORDER — OXYCODONE HCL 5 MG/5ML PO SOLN: 5.0000 mg | Freq: Once | ORAL | Status: DC | PRN

## 2018-09-17 MED ORDER — FENTANYL CITRATE (PF) 100 MCG/2ML IJ SOLN
50.0000 ug | INTRAMUSCULAR | Status: DC | PRN
  Administered 2018-09-17: 100 ug via INTRAVENOUS
  Administered 2018-09-17: 25 ug via INTRAVENOUS

## 2018-09-17 MED ORDER — LACTATED RINGERS IV SOLN
INTRAVENOUS | Status: DC
  Administered 2018-09-17 (×2): via INTRAVENOUS

## 2018-09-17 MED ORDER — SUCCINYLCHOLINE CHLORIDE 20 MG/ML IJ SOLN
INTRAMUSCULAR | Status: DC | PRN
  Administered 2018-09-17: 100 mg via INTRAVENOUS

## 2018-09-17 MED ORDER — BUPIVACAINE LIPOSOME 1.3 % IJ SUSP
INTRAMUSCULAR | Status: DC | PRN
  Administered 2018-09-17: 133 mg via PERINEURAL

## 2018-09-17 MED ORDER — EPHEDRINE SULFATE 50 MG/ML IJ SOLN
INTRAMUSCULAR | Status: DC | PRN
  Administered 2018-09-17: 10 mg via INTRAVENOUS

## 2018-09-17 MED ORDER — ONDANSETRON HCL 4 MG/2ML IJ SOLN
INTRAMUSCULAR | Status: DC | PRN
  Administered 2018-09-17: 4 mg via INTRAVENOUS

## 2018-09-17 MED ORDER — MIDAZOLAM HCL 2 MG/2ML IJ SOLN
INTRAMUSCULAR | Status: AC
  Filled 2018-09-17: qty 2

## 2018-09-17 MED ORDER — PROPOFOL 10 MG/ML IV BOLUS
INTRAVENOUS | Status: DC | PRN
  Administered 2018-09-17: 60 mg via INTRAVENOUS
  Administered 2018-09-17: 140 mg via INTRAVENOUS

## 2018-09-17 MED ORDER — FENTANYL CITRATE (PF) 100 MCG/2ML IJ SOLN: 25.0000 ug | INTRAMUSCULAR | Status: DC | PRN

## 2018-09-17 MED ORDER — LIDOCAINE HCL (CARDIAC) PF 100 MG/5ML IV SOSY
PREFILLED_SYRINGE | INTRAVENOUS | Status: DC | PRN
  Administered 2018-09-17: 60 mg via INTRAVENOUS

## 2018-09-17 MED ORDER — SCOPOLAMINE 1 MG/3DAYS TD PT72: 1.0000 | MEDICATED_PATCH | Freq: Once | TRANSDERMAL | Status: DC | PRN

## 2018-09-17 MED ORDER — LACTATED RINGERS IV SOLN: INTRAVENOUS | Status: DC

## 2018-09-17 MED ORDER — OXYCODONE HCL 5 MG PO TABS: 5.0000 mg | ORAL_TABLET | Freq: Once | ORAL | Status: DC | PRN

## 2018-09-17 MED ORDER — CEFAZOLIN SODIUM-DEXTROSE 2-4 GM/100ML-% IV SOLN
INTRAVENOUS | Status: AC
  Filled 2018-09-17: qty 100

## 2018-09-17 SURGICAL SUPPLY — 68 items
ADH SKN CLS APL DERMABOND .7 (GAUZE/BANDAGES/DRESSINGS)
AID PSTN UNV HD RSTRNT DISP (MISCELLANEOUS) ×2
APL SKNCLS STERI-STRIP NONHPOA (GAUZE/BANDAGES/DRESSINGS)
BENZOIN TINCTURE PRP APPL 2/3 (GAUZE/BANDAGES/DRESSINGS) IMPLANT
BLADE EXCALIBUR 4.0X13 (MISCELLANEOUS) ×3 IMPLANT
BLADE SURG 15 STRL LF DISP TIS (BLADE) IMPLANT
BLADE SURG 15 STRL SS (BLADE)
BUR VERTEX HOODED 4.5 (BURR) IMPLANT
BURR OVAL 8 FLU 4.0X13 (MISCELLANEOUS) ×1 IMPLANT
CANNULA SHOULDER 7CM (CANNULA) ×3 IMPLANT
CANNULA TWIST IN 8.25X7CM (CANNULA) IMPLANT
COVER WAND RF STERILE (DRAPES) IMPLANT
DECANTER SPIKE VIAL GLASS SM (MISCELLANEOUS) IMPLANT
DERMABOND ADVANCED (GAUZE/BANDAGES/DRESSINGS)
DERMABOND ADVANCED .7 DNX12 (GAUZE/BANDAGES/DRESSINGS) IMPLANT
DRAPE STERI 35X30 U-POUCH (DRAPES) ×3 IMPLANT
DRAPE U-SHAPE 47X51 STRL (DRAPES) ×3 IMPLANT
DRAPE U-SHAPE 76X120 STRL (DRAPES) ×6 IMPLANT
DRSG EMULSION OIL 3X3 NADH (GAUZE/BANDAGES/DRESSINGS) ×3 IMPLANT
DRSG PAD ABDOMINAL 8X10 ST (GAUZE/BANDAGES/DRESSINGS) ×3 IMPLANT
DURAPREP 26ML APPLICATOR (WOUND CARE) ×3 IMPLANT
ELECT MENISCUS 165MM 90D (ELECTRODE) IMPLANT
ELECT REM PT RETURN 9FT ADLT (ELECTROSURGICAL) ×3
ELECTRODE REM PT RTRN 9FT ADLT (ELECTROSURGICAL) ×2 IMPLANT
GAUZE SPONGE 4X4 12PLY STRL (GAUZE/BANDAGES/DRESSINGS) ×3 IMPLANT
GLOVE BIO SURGEON STRL SZ8 (GLOVE) ×6 IMPLANT
GLOVE BIOGEL PI IND STRL 8 (GLOVE) ×4 IMPLANT
GLOVE BIOGEL PI INDICATOR 8 (GLOVE) ×2
GOWN STRL REUS W/ TWL LRG LVL3 (GOWN DISPOSABLE) ×4 IMPLANT
GOWN STRL REUS W/ TWL XL LVL3 (GOWN DISPOSABLE) ×4 IMPLANT
GOWN STRL REUS W/TWL LRG LVL3 (GOWN DISPOSABLE) ×6
GOWN STRL REUS W/TWL XL LVL3 (GOWN DISPOSABLE) ×6
MANIFOLD NEPTUNE II (INSTRUMENTS) ×3 IMPLANT
NDL SCORPION MULTI FIRE (NEEDLE) IMPLANT
NDL SUT 6 .5 CRC .975X.05 MAYO (NEEDLE) IMPLANT
NEEDLE MAYO TAPER (NEEDLE)
NEEDLE SCORPION MULTI FIRE (NEEDLE) IMPLANT
NS IRRIG 1000ML POUR BTL (IV SOLUTION) IMPLANT
PACK ARTHROSCOPY DSU (CUSTOM PROCEDURE TRAY) ×3 IMPLANT
PACK BASIN DAY SURGERY FS (CUSTOM PROCEDURE TRAY) ×3 IMPLANT
PASSER SUT SWANSON 36MM LOOP (INSTRUMENTS) IMPLANT
PENCIL BUTTON HOLSTER BLD 10FT (ELECTRODE) IMPLANT
PORT APPOLLO RF 90DEGREE MULTI (SURGICAL WAND) ×3 IMPLANT
RESTRAINT HEAD UNIVERSAL NS (MISCELLANEOUS) ×3 IMPLANT
SHEET MEDIUM DRAPE 40X70 STRL (DRAPES) ×3 IMPLANT
SLEEVE SCD COMPRESS KNEE MED (MISCELLANEOUS) IMPLANT
SLING ARM FOAM STRAP LRG (SOFTGOODS) IMPLANT
SLING ARM MED ADULT FOAM STRAP (SOFTGOODS) IMPLANT
SLING ARM SM FOAM STRAP (SOFTGOODS) IMPLANT
SLING ARM XL FOAM STRAP (SOFTGOODS) IMPLANT
SPONGE LAP 4X18 RFD (DISPOSABLE) IMPLANT
STRIP CLOSURE SKIN 1/2X4 (GAUZE/BANDAGES/DRESSINGS) IMPLANT
SUCTION FRAZIER HANDLE 10FR (MISCELLANEOUS)
SUCTION TUBE FRAZIER 10FR DISP (MISCELLANEOUS) IMPLANT
SUT ETHIBOND 2 OS 4 DA (SUTURE) IMPLANT
SUT ETHILON 3 0 PS 1 (SUTURE) ×3 IMPLANT
SUT FIBERWIRE #2 38 T-5 BLUE (SUTURE)
SUT PDS AB 2-0 CT2 27 (SUTURE) IMPLANT
SUT VIC AB 0 SH 27 (SUTURE) IMPLANT
SUT VIC AB 2-0 SH 27 (SUTURE)
SUT VIC AB 2-0 SH 27XBRD (SUTURE) IMPLANT
SUT VICRYL 4-0 PS2 18IN ABS (SUTURE) IMPLANT
SUTURE FIBERWR #2 38 T-5 BLUE (SUTURE) IMPLANT
SYR BULB 3OZ (MISCELLANEOUS) IMPLANT
TOWEL GREEN STERILE FF (TOWEL DISPOSABLE) ×3 IMPLANT
TUBING ARTHROSCOPY IRRIG 16FT (MISCELLANEOUS) ×3 IMPLANT
WATER STERILE IRR 1000ML POUR (IV SOLUTION) ×3 IMPLANT
YANKAUER SUCT BULB TIP NO VENT (SUCTIONS) IMPLANT

## 2018-09-17 NOTE — Anesthesia Procedure Notes (Signed)
Procedure Name: Intubation Date/Time: 09/17/2018 2:38 PM Performed by: Signe Colt, CRNA Pre-anesthesia Checklist: Patient identified, Emergency Drugs available, Suction available and Patient being monitored Patient Re-evaluated:Patient Re-evaluated prior to induction Oxygen Delivery Method: Circle system utilized Preoxygenation: Pre-oxygenation with 100% oxygen Induction Type: IV induction Ventilation: Mask ventilation without difficulty Laryngoscope Size: Mac, 3 and Glidescope Grade View: Grade III Tube type: Oral Tube size: 7.0 mm Number of attempts: 1 Airway Equipment and Method: Stylet and Oral airway Placement Confirmation: ETT inserted through vocal cords under direct vision,  positive ETCO2 and breath sounds checked- equal and bilateral Secured at: 21 cm Tube secured with: Tape Dental Injury: Teeth and Oropharynx as per pre-operative assessment  Comments: Patient on shoulder bed, Smooth IV induction, DL x 1 unable to visualize VC, Glide scope DL x 1 VC visualized atraumatic placement of 7.0 ETT BBS, + ETCO2. Teeth and lips unchanged

## 2018-09-17 NOTE — Progress Notes (Signed)
AssistedDr. Moser with right, ultrasound guided, interscalene  block. Side rails up, monitors on throughout procedure. See vital signs in flow sheet. Tolerated Procedure well.  

## 2018-09-17 NOTE — Transfer of Care (Signed)
Immediate Anesthesia Transfer of Care Note  Patient: Miranda Shaw  Procedure(s) Performed: SHOULDER ARTHROSCOPY WITH SUBACROMIAL DECOMPRESSION, DEBRIDEMENT (Right Shoulder) CLOSED MANIPULATION SHOULDER (Right Shoulder)  Patient Location: PACU  Anesthesia Type:GA combined with regional for post-op pain  Level of Consciousness: drowsy and patient cooperative  Airway & Oxygen Therapy: Patient Spontanous Breathing and Patient connected to nasal cannula oxygen  Post-op Assessment: Report given to RN and Post -op Vital signs reviewed and stable  Post vital signs: Reviewed and stable  Last Vitals:  Vitals Value Taken Time  BP    Temp    Pulse 91 09/17/2018  3:28 PM  Resp 21 09/17/2018  3:28 PM  SpO2 100 % 09/17/2018  3:28 PM  Vitals shown include unvalidated device data.  Last Pain:  Vitals:   09/17/18 1320  TempSrc: Oral  PainSc: 0-No pain         Complications: No apparent anesthesia complications

## 2018-09-17 NOTE — Op Note (Signed)
Miranda Shaw [id number] 09/17/2018   PRE-OP DIAGNOSIS: right sh imp and adhesive capsulitis  POST-OP DIAGNOSIS: same  PROCEDURE: right sh scope and manip  ANESTHESIA: general and block  [name]   Dictation #:  [id number]

## 2018-09-17 NOTE — Interval H&P Note (Signed)
History and Physical Interval Note:  09/17/2018 6:83 PM  Miranda Shaw  has presented today for surgery, with the diagnosis of Right shoulder impingment and adhesive capsulitis.  The various methods of treatment have been discussed with the patient and family. After consideration of risks, benefits and other options for treatment, the patient has consented to  Procedure(s): SHOULDER ARTHROSCOPY WITH SUBACROMIAL DECOMPRESSION (Right) as a surgical intervention.  The patient's history has been reviewed, patient examined, no change in status, stable for surgery.  I have reviewed the patient's chart and labs.  Questions were answered to the patient's satisfaction.     [name]

## 2018-09-17 NOTE — Anesthesia Postprocedure Evaluation (Signed)
Anesthesia Post Note  Patient: Miranda Shaw  Procedure(s) Performed: SHOULDER ARTHROSCOPY WITH SUBACROMIAL DECOMPRESSION, DEBRIDEMENT (Right Shoulder) CLOSED MANIPULATION SHOULDER (Right Shoulder)     Patient location during evaluation: PACU Anesthesia Type: Regional and General Level of consciousness: awake and alert Pain management: pain level controlled Vital Signs Assessment: post-procedure vital signs reviewed and stable Respiratory status: spontaneous breathing, nonlabored ventilation, respiratory function stable and patient connected to nasal cannula oxygen Cardiovascular status: blood pressure returned to baseline and stable Postop Assessment: no apparent nausea or vomiting Anesthetic complications: no    Last Vitals:  Vitals:   09/17/18 1611 09/17/18 1620  BP:  (!) 106/56  Pulse: 95 96  Resp: (!) 25 20  Temp:  (!) 36.4 C  SpO2: 94% 96%    Last Pain:  Vitals:   09/17/18 1620  TempSrc:   PainSc: 0-No pain                 Kery Haltiwanger

## 2018-09-17 NOTE — Anesthesia Preprocedure Evaluation (Signed)
Anesthesia Evaluation  Patient identified by MRN, date of birth, ID band Patient awake    Reviewed: Allergy & Precautions, NPO status , Patient's Chart, lab work & pertinent test results  History of Anesthesia Complications Negative for: history of anesthetic complications  Airway Mallampati: II  TM Distance: >3 FB Neck ROM: Full    Dental  (+) Teeth Intact, Dental Advisory Given   Pulmonary former smoker,    breath sounds clear to auscultation       Cardiovascular negative cardio ROS   Rhythm:Regular     Neuro/Psych negative neurological ROS  negative psych ROS   GI/Hepatic negative GI ROS, Neg liver ROS,   Endo/Other  diabetes, Insulin DependentHypothyroidism Insulin pump with glucose meter  Renal/GU negative Renal ROS     Musculoskeletal  (+) Arthritis ,   Abdominal   Peds  Hematology negative hematology ROS (+)   Anesthesia Other Findings   Reproductive/Obstetrics                             Anesthesia Physical Anesthesia Plan  ASA: II  Anesthesia Plan: General and Regional   Post-op Pain Management:  Regional for Post-op pain   Induction: Intravenous  PONV Risk Score and Plan: 3 and Ondansetron and Dexamethasone  Airway Management Planned: Oral ETT  Additional Equipment: None  Intra-op Plan:   Post-operative Plan: Extubation in OR  Informed Consent: I have reviewed the patients History and Physical, chart, labs and discussed the procedure including the risks, benefits and alternatives for the proposed anesthesia with the patient or authorized representative who has indicated his/her understanding and acceptance.     Dental advisory given  Plan Discussed with: CRNA and Surgeon  Anesthesia Plan Comments:         Anesthesia Quick Evaluation

## 2018-09-17 NOTE — Anesthesia Procedure Notes (Signed)
Anesthesia Regional Block: Interscalene brachial plexus block   Pre-Anesthetic Checklist: ,, timeout performed, Correct Patient, Correct Site, Correct Laterality, Correct Procedure, Correct Position, site marked, Risks and benefits discussed,  Surgical consent,  Pre-op evaluation,  At surgeon's request and post-op pain management  Laterality: Right and Upper  Prep: chloraprep       Needles:  Injection technique: Single-shot     Needle Length: 5cm  Needle Gauge: 22     Additional Needles: Arrow StimuQuik ECHO Echogenic Stimulating PNB Needle  Procedures:, nerve stimulator,,, ultrasound used (permanent image in chart),,,,   Nerve Stimulator or Paresthesia:  Response: deltoid, 0.2 mA,   Additional Responses:   Narrative:  Start time: 09/17/2018 1:52 PM End time: 09/17/2018 2:02 PM Injection made incrementally with aspirations every 5 mL.  Performed by: Personally  Anesthesiologist: Oleta Mouse, MD

## 2018-09-17 NOTE — H&P (Signed)
Miranda Shaw is an 63 y.o. female.   Chief Complaint: Right Shoulder pain HPI: Miranda Shaw is in again about her right shoulder.  She continues with the same stiffness and pain.  She has been through an MRI scan since the last time she was in.    MRI:  I reviewed an MRI scan films and report of a study done at [facility] on 08/23/18.  There is no evidence of mass.  The biceps tendon looks normal and she has a small intrasubstance tear of the distal supraspinatus.  Past Medical History:  Diagnosis Date  . Cataract    bilaterally removed  . Diabetes mellitus without complication ([place])   . Hypothyroidism   . Shoulder impingement syndrome, right   . Thyroid disease     Past Surgical History:  Procedure Laterality Date  . COLONOSCOPY  2008-05  . left shoulder surgery    . right elbow surgery      Family History  Problem Relation Age of Onset  . Breast cancer Mother   . Breast cancer Maternal Aunt   . Breast cancer Paternal Aunt   . Pancreatic cancer Maternal Grandfather   . Diabetes Paternal Grandfather   . Colon cancer Neg Hx   . Colon polyps Neg Hx   . Esophageal cancer Neg Hx   . Rectal cancer Neg Hx   . Stomach cancer Neg Hx    Social History:  reports that she quit smoking about 16 years ago. She has never used smokeless tobacco. She reports current alcohol use. She reports that she does not use drugs.  Allergies: No Known Allergies  No medications prior to admission.    No results found for this or any previous visit (from the past 48 hour(s)). No results found.  Review of Systems  Musculoskeletal: Positive for joint pain.       Right Shoulder  All other systems reviewed and are negative.   Height 5\' 5"  (1.651 m), weight 66.2 kg. Physical Exam  Constitutional: She is oriented to person, place, and time. She appears well-developed and well-nourished.  HENT:  Head: Normocephalic and atraumatic.  Eyes: Pupils are equal, round, and reactive to light.  Neck:  Normal range of motion.  Cardiovascular: Normal rate and regular rhythm.  Respiratory: Effort normal.  GI: Soft.  Musculoskeletal:     Comments: Right shoulder motion is about the same as it was at the last visit.  She has forward flexion to 110 and external rotation to about 20 compared to 50 on the opposite side and internal rotation to the level of her side pocket.  I do not feel much of a lump on the anterior aspect of the shoulder.  Sensation and motor function are intact distally.  She has palpable pulses at her wrists.  Cervical motion is full and there is no palpable lymphadenopathy.    Neurological: She is alert and oriented to person, place, and time.  Skin: Skin is warm and dry.  Psychiatric: She has a normal mood and affect. Her behavior is normal. Judgment and thought content normal.     Assessment/Plan Assessment: Right shoulder adhesive capsulitis with MRI 2020 injected 2  Plan: Miranda Shaw would benefit from a shoulder arthroscopy and manipulation.  This is identical to what she went through 9 years ago on the opposite side.  I reviewed risk of anesthesia, infection, fracture.  We should set up some therapy a day or two after the surgery to get her going.  Miranda Sachs Joya Willmott, PA-C 09/17/2018, 8:20 AM

## 2018-09-17 NOTE — Discharge Instructions (Signed)
Regional Anesthesia Blocks  1. Numbness or the inability to move the "blocked" extremity may last from 3-48 hours after placement. The length of time depends on the medication injected and your individual response to the medication. If the numbness is not going away after 48 hours, call your surgeon.  2. The extremity that is blocked will need to be protected until the numbness is gone and the  Strength has returned. Because you cannot feel it, you will need to take extra care to avoid injury. Because it may be weak, you may have difficulty moving it or using it. You may not know what position it is in without looking at it while the block is in effect.  3. For blocks in the legs and feet, returning to weight bearing and walking needs to be done carefully. You will need to wait until the numbness is entirely gone and the strength has returned. You should be able to move your leg and foot normally before you try and bear weight or walk. You will need someone to be with you when you first try to ensure you do not fall and possibly risk injury.  4. Bruising and tenderness at the needle site are common side effects and will resolve in a few days.  5. Persistent numbness or new problems with movement should be communicated to the surgeon or the [facility] [phone] [place] [phone]).       Post Anesthesia Home Care Instructions  Activity: Get plenty of rest for the remainder of the day. A responsible individual must stay with you for 24 hours following the procedure.  For the next 24 hours, DO NOT: -Drive a car -Paediatric nurse -Drink alcoholic beverages -Take any medication unless instructed by your physician -Make any legal decisions or sign important papers.  Meals: Start with liquid foods such as gelatin or soup. Progress to regular foods as tolerated. Avoid greasy, spicy, heavy foods. If nausea and/or vomiting occur, drink only clear liquids  until the nausea and/or vomiting subsides. Call your physician if vomiting continues.  Special Instructions/Symptoms: Your throat may feel dry or sore from the anesthesia or the breathing tube placed in your throat during surgery. If this causes discomfort, gargle with warm salt water. The discomfort should disappear within 24 hours.  If you had a scopolamine patch placed behind your ear for the management of post- operative nausea and/or vomiting:  1. The medication in the patch is effective for 72 hours, after which it should be removed.  Wrap patch in a tissue and discard in the trash. Wash hands thoroughly with soap and water. 2. You may remove the patch earlier than 72 hours if you experience unpleasant side effects which may include dry mouth, dizziness or visual disturbances. 3. Avoid touching the patch. Wash your hands with soap and water after contact with the patch.        Information for Discharge Teaching: EXPAREL (bupivacaine liposome injectable suspension)   Your surgeon or anesthesiologist gave you EXPAREL(bupivacaine) to help control your pain after surgery.   EXPAREL is a local anesthetic that provides pain relief by numbing the tissue around the surgical site.  EXPAREL is designed to release pain medication over time and can control pain for up to 72 hours.  Depending on how you respond to EXPAREL, you may require less pain medication during your recovery.  Possible side effects:  Temporary loss of sensation or ability to move in the area where bupivacaine was  injected.  Nausea, vomiting, constipation  Rarely, numbness and tingling in your mouth or lips, lightheadedness, or anxiety may occur.  Call your doctor right away if you think you may be experiencing any of these sensations, or if you have other questions regarding possible side effects.  Follow all other discharge instructions given to you by your surgeon or nurse. Eat a healthy diet and drink plenty of  water or other fluids.  If you return to the hospital for any reason within 96 hours following the administration of EXPAREL, it is important for health care providers to know that you have received this anesthetic. A teal colored band has been placed on your arm with the date, time and amount of EXPAREL you have received in order to alert and inform your health care providers. Please leave this armband in place for the full 96 hours following administration, and then you may remove the band.

## 2018-09-17 NOTE — Op Note (Signed)
NAME: Miranda Shaw, Miranda Shaw MEDICAL RECORD [id number] ACCOUNT [id number] DATE OF BIRTH:10/03/1955 FACILITY: [facility] LOCATION: [place], [place]  OPERATIVE REPORT  DATE OF PROCEDURE:  09/17/2018  PREOPERATIVE DIAGNOSES: 1.  Right shoulder adhesive capsulitis. 2.  Right shoulder impingement.  POSTOPERATIVE DIAGNOSES:   1.  Right shoulder adhesive capsulitis. 2.  Right shoulder impingement.  PROCEDURE: 1.  Right shoulder arthroscopic acromioplasty. 2.  Right shoulder arthroscopic debridement. 3.  Right shoulder manipulation.  ANESTHESIA:  General and block.  ATTENDING SURGEON:  Melrose Nakayama, MD  ASSISTANT:  Clarene Critchley, PA-C  INDICATIONS:  The patient is a 63 year old woman with many months of right shoulder pain and stiffness.  This has persisted despite physical therapy and 2 glenohumeral injections.  By MRI scan, she has a small intrasubstance tear of the distal  supraspinatus and things consistent with adhesive capsulitis.  She has pain which limits her ability to rest and use her arm, and she was offered operative intervention.  Interestingly, she is status post surgery for a similar problem involving the  opposite shoulder about 10 years back.  Informed operative consent was obtained after discussion of possible complications including reaction to anesthesia and infection.  SUMMARY OF FINDINGS AND PROCEDURE:  Under general anesthesia and a block, a right shoulder arthroscopy was performed.  We found some significant scar tissue, both glenohumeral and subacromial, and she underwent extensive debridement of both aspects of  the shoulder.  At most, she had partial-thickness tearing of the supraspinatus, but certainly nothing worthy of repair, so the biceps tendon looked normal.  There were no degenerative changes.  I performed a conservative acromioplasty and manipulation,  improving her forward flexion from about 100-150 and external rotation from  20-50 degrees.  She was closed primarily and discharged home.  DESCRIPTION OF PROCEDURE:  The patient was brought to the operating suite where general anesthetic was applied without difficulty.  She was also given a block in the preanesthesia area.  She was positioned in beach-chair position and prepped and draped  in normal sterile fashion.  After the administration of preoperative IV Kefzol and appropriate time-out, an arthroscopy of the right shoulder was performed for a total of 2 portals.  Glenohumeral joint showed no degenerative change, and the biceps tendon  was intact and in proper position.  The rotator cuff appeared benign from below.  In the subacromial space, she had some bursitis and thick scar tissue.  A thorough bursectomy was done along with extensive debridement of this portion of the shoulder.  I  performed an acromioplasty with the bur in the lateral position followed by transfer of the bur to the posterior position.  I also performed a manipulation, which improved her forward flexion from 100-150 degrees with some audible pops along the way.  I  also improved her external rotation from about 20-50 degrees.  The shoulder was thoroughly irrigated followed by reapproximation of the portals loosely with nylon.  Adaptic was applied followed by dry gauze and tape.  ESTIMATED BLOOD LOSS AND FLUIDS:  Can be obtained from anesthesia records.  DISPOSITION:  The patient was extubated in the operating room and taken to recovery room in stable condition.  She wants to go home the same day and follow up in the office next week.  I will contact her by phone tonight.  LN/NUANCE  D:09/17/2018 T:09/17/2018 JOB:[id number]

## 2018-09-20 ENCOUNTER — Encounter (HOSPITAL_BASED_OUTPATIENT_CLINIC_OR_DEPARTMENT_OTHER): Payer: Self-pay | Admitting: Orthopaedic Surgery

## 2018-09-21 DIAGNOSIS — Z9889 Other specified postprocedural states: Secondary | ICD-10-CM | POA: Diagnosis not present

## 2018-09-21 DIAGNOSIS — M25611 Stiffness of right shoulder, not elsewhere classified: Secondary | ICD-10-CM | POA: Diagnosis not present

## 2018-09-21 DIAGNOSIS — M25511 Pain in right shoulder: Secondary | ICD-10-CM | POA: Diagnosis not present

## 2018-09-23 DIAGNOSIS — M25551 Pain in right hip: Secondary | ICD-10-CM | POA: Diagnosis not present

## 2018-09-23 DIAGNOSIS — M25611 Stiffness of right shoulder, not elsewhere classified: Secondary | ICD-10-CM | POA: Diagnosis not present

## 2018-09-23 DIAGNOSIS — Z9889 Other specified postprocedural states: Secondary | ICD-10-CM | POA: Diagnosis not present

## 2018-09-27 DIAGNOSIS — M25611 Stiffness of right shoulder, not elsewhere classified: Secondary | ICD-10-CM | POA: Diagnosis not present

## 2018-09-27 DIAGNOSIS — M25511 Pain in right shoulder: Secondary | ICD-10-CM | POA: Diagnosis not present

## 2018-09-27 DIAGNOSIS — Z9889 Other specified postprocedural states: Secondary | ICD-10-CM | POA: Diagnosis not present

## 2018-09-29 MED FILL — EZETIMIBE 10 MG TABS: 10 | 30 days supply | Qty: 30 | Fill #6

## 2018-09-29 MED FILL — ROSUVASTATIN CALCIUM 10 MG: 10 | 35 days supply | Qty: 15 | Fill #2

## 2018-09-30 DIAGNOSIS — Z9889 Other specified postprocedural states: Secondary | ICD-10-CM | POA: Diagnosis not present

## 2018-09-30 DIAGNOSIS — M25511 Pain in right shoulder: Secondary | ICD-10-CM | POA: Diagnosis not present

## 2018-09-30 DIAGNOSIS — M25611 Stiffness of right shoulder, not elsewhere classified: Secondary | ICD-10-CM | POA: Diagnosis not present

## 2018-10-05 DIAGNOSIS — Z01419 Encounter for gynecological examination (general) (routine) without abnormal findings: Secondary | ICD-10-CM | POA: Diagnosis not present

## 2018-10-05 DIAGNOSIS — Z6823 Body mass index (BMI) 23.0-23.9, adult: Secondary | ICD-10-CM | POA: Diagnosis not present

## 2018-10-05 DIAGNOSIS — M25511 Pain in right shoulder: Secondary | ICD-10-CM | POA: Diagnosis not present

## 2018-10-05 DIAGNOSIS — M25611 Stiffness of right shoulder, not elsewhere classified: Secondary | ICD-10-CM | POA: Diagnosis not present

## 2018-10-05 DIAGNOSIS — Z9889 Other specified postprocedural states: Secondary | ICD-10-CM | POA: Diagnosis not present

## 2018-10-06 DIAGNOSIS — E10319 Type 1 diabetes mellitus with unspecified diabetic retinopathy without macular edema: Secondary | ICD-10-CM | POA: Diagnosis not present

## 2018-10-06 DIAGNOSIS — Z794 Long term (current) use of insulin: Secondary | ICD-10-CM | POA: Diagnosis not present

## 2018-10-06 DIAGNOSIS — Z4681 Encounter for fitting and adjustment of insulin pump: Secondary | ICD-10-CM | POA: Diagnosis not present

## 2018-10-06 DIAGNOSIS — N183 Chronic kidney disease, stage 3 (moderate): Secondary | ICD-10-CM | POA: Diagnosis not present

## 2018-10-08 DIAGNOSIS — Z9889 Other specified postprocedural states: Secondary | ICD-10-CM | POA: Diagnosis not present

## 2018-10-08 DIAGNOSIS — M25511 Pain in right shoulder: Secondary | ICD-10-CM | POA: Diagnosis not present

## 2018-10-08 DIAGNOSIS — M25611 Stiffness of right shoulder, not elsewhere classified: Secondary | ICD-10-CM | POA: Diagnosis not present

## 2018-10-13 DIAGNOSIS — M25611 Stiffness of right shoulder, not elsewhere classified: Secondary | ICD-10-CM | POA: Diagnosis not present

## 2018-10-13 DIAGNOSIS — M25511 Pain in right shoulder: Secondary | ICD-10-CM | POA: Diagnosis not present

## 2018-10-13 DIAGNOSIS — Z9889 Other specified postprocedural states: Secondary | ICD-10-CM | POA: Diagnosis not present

## 2018-10-18 MED FILL — ALPRAZOLAM 0.5 MG TABS: 0.5 | 30 days supply | Qty: 90 | Fill #0

## 2018-10-19 DIAGNOSIS — E109 Type 1 diabetes mellitus without complications: Secondary | ICD-10-CM | POA: Diagnosis not present

## 2018-10-19 DIAGNOSIS — M25511 Pain in right shoulder: Secondary | ICD-10-CM | POA: Diagnosis not present

## 2018-10-19 DIAGNOSIS — Z9889 Other specified postprocedural states: Secondary | ICD-10-CM | POA: Diagnosis not present

## 2018-10-19 DIAGNOSIS — M25611 Stiffness of right shoulder, not elsewhere classified: Secondary | ICD-10-CM | POA: Diagnosis not present

## 2018-10-21 DIAGNOSIS — M25511 Pain in right shoulder: Secondary | ICD-10-CM | POA: Diagnosis not present

## 2018-10-21 DIAGNOSIS — M25611 Stiffness of right shoulder, not elsewhere classified: Secondary | ICD-10-CM | POA: Diagnosis not present

## 2018-10-21 DIAGNOSIS — Z9889 Other specified postprocedural states: Secondary | ICD-10-CM | POA: Diagnosis not present

## 2018-10-25 DIAGNOSIS — M25511 Pain in right shoulder: Secondary | ICD-10-CM | POA: Diagnosis not present

## 2018-10-25 DIAGNOSIS — M25611 Stiffness of right shoulder, not elsewhere classified: Secondary | ICD-10-CM | POA: Diagnosis not present

## 2018-10-25 DIAGNOSIS — Z9889 Other specified postprocedural states: Secondary | ICD-10-CM | POA: Diagnosis not present

## 2018-10-27 DIAGNOSIS — M25511 Pain in right shoulder: Secondary | ICD-10-CM | POA: Diagnosis not present

## 2018-10-27 DIAGNOSIS — Z9889 Other specified postprocedural states: Secondary | ICD-10-CM | POA: Diagnosis not present

## 2018-10-27 DIAGNOSIS — M25611 Stiffness of right shoulder, not elsewhere classified: Secondary | ICD-10-CM | POA: Diagnosis not present

## 2018-10-27 MED FILL — HumaLOG 100 UNIT/ML SOLN: 100 | 90 days supply | Qty: 90 | Fill #0

## 2018-10-27 MED FILL — ROSUVASTATIN CALCIUM 10 MG: 10 | 35 days supply | Qty: 15 | Fill #3

## 2018-10-27 MED FILL — EZETIMIBE 10 MG TABS: 10 | 30 days supply | Qty: 30 | Fill #0

## 2018-11-01 DIAGNOSIS — M25511 Pain in right shoulder: Secondary | ICD-10-CM | POA: Diagnosis not present

## 2018-11-01 DIAGNOSIS — Z9889 Other specified postprocedural states: Secondary | ICD-10-CM | POA: Diagnosis not present

## 2018-11-01 DIAGNOSIS — M25611 Stiffness of right shoulder, not elsewhere classified: Secondary | ICD-10-CM | POA: Diagnosis not present

## 2018-11-03 DIAGNOSIS — Z9889 Other specified postprocedural states: Secondary | ICD-10-CM | POA: Diagnosis not present

## 2018-11-03 DIAGNOSIS — M25611 Stiffness of right shoulder, not elsewhere classified: Secondary | ICD-10-CM | POA: Diagnosis not present

## 2018-11-03 DIAGNOSIS — M25511 Pain in right shoulder: Secondary | ICD-10-CM | POA: Diagnosis not present

## 2018-11-18 DIAGNOSIS — E109 Type 1 diabetes mellitus without complications: Secondary | ICD-10-CM | POA: Diagnosis not present

## 2018-11-19 DIAGNOSIS — E10319 Type 1 diabetes mellitus with unspecified diabetic retinopathy without macular edema: Secondary | ICD-10-CM | POA: Diagnosis not present

## 2018-11-19 DIAGNOSIS — E109 Type 1 diabetes mellitus without complications: Secondary | ICD-10-CM | POA: Diagnosis not present

## 2018-11-19 DIAGNOSIS — Z0000 Encounter for general adult medical examination without abnormal findings: Secondary | ICD-10-CM | POA: Diagnosis not present

## 2018-11-19 DIAGNOSIS — E038 Other specified hypothyroidism: Secondary | ICD-10-CM | POA: Diagnosis not present

## 2018-11-19 DIAGNOSIS — M810 Age-related osteoporosis without current pathological fracture: Secondary | ICD-10-CM | POA: Diagnosis not present

## 2018-11-26 DIAGNOSIS — E785 Hyperlipidemia, unspecified: Secondary | ICD-10-CM | POA: Diagnosis not present

## 2018-11-26 DIAGNOSIS — M810 Age-related osteoporosis without current pathological fracture: Secondary | ICD-10-CM | POA: Diagnosis not present

## 2018-11-26 DIAGNOSIS — Z1331 Encounter for screening for depression: Secondary | ICD-10-CM | POA: Diagnosis not present

## 2018-11-26 DIAGNOSIS — Z1339 Encounter for screening examination for other mental health and behavioral disorders: Secondary | ICD-10-CM | POA: Diagnosis not present

## 2018-11-26 DIAGNOSIS — E10319 Type 1 diabetes mellitus with unspecified diabetic retinopathy without macular edema: Secondary | ICD-10-CM | POA: Diagnosis not present

## 2018-11-26 DIAGNOSIS — E11319 Type 2 diabetes mellitus with unspecified diabetic retinopathy without macular edema: Secondary | ICD-10-CM | POA: Diagnosis not present

## 2018-11-26 DIAGNOSIS — Z794 Long term (current) use of insulin: Secondary | ICD-10-CM | POA: Diagnosis not present

## 2018-11-26 DIAGNOSIS — Z0000 Encounter for general adult medical examination without abnormal findings: Secondary | ICD-10-CM | POA: Diagnosis not present

## 2018-11-26 DIAGNOSIS — N183 Chronic kidney disease, stage 3 (moderate): Secondary | ICD-10-CM | POA: Diagnosis not present

## 2018-11-26 DIAGNOSIS — M25511 Pain in right shoulder: Secondary | ICD-10-CM | POA: Diagnosis not present

## 2018-11-26 DIAGNOSIS — E039 Hypothyroidism, unspecified: Secondary | ICD-10-CM | POA: Diagnosis not present

## 2018-12-03 DIAGNOSIS — E109 Type 1 diabetes mellitus without complications: Secondary | ICD-10-CM | POA: Diagnosis not present

## 2018-12-09 MED FILL — EZETIMIBE 10 MG TABS: 10 | 30 days supply | Qty: 30 | Fill #1

## 2018-12-09 MED FILL — ROSUVASTATIN CALCIUM 10 MG: 10 | 35 days supply | Qty: 15 | Fill #4

## 2018-12-17 DIAGNOSIS — M25511 Pain in right shoulder: Secondary | ICD-10-CM | POA: Diagnosis not present

## 2019-01-14 DIAGNOSIS — M25511 Pain in right shoulder: Secondary | ICD-10-CM | POA: Diagnosis not present

## 2019-01-17 DIAGNOSIS — E109 Type 1 diabetes mellitus without complications: Secondary | ICD-10-CM | POA: Diagnosis not present

## 2019-01-17 DIAGNOSIS — E10319 Type 1 diabetes mellitus with unspecified diabetic retinopathy without macular edema: Secondary | ICD-10-CM | POA: Diagnosis not present

## 2019-01-31 MED FILL — EZETIMIBE 10 MG TABS: 10 | 30 days supply | Qty: 30 | Fill #2

## 2019-01-31 MED FILL — ROSUVASTATIN CALCIUM 10 MG: 10 | 35 days supply | Qty: 15 | Fill #5

## 2019-01-31 MED FILL — HumaLOG 100 UNIT/ML SOLN: 100 | 90 days supply | Qty: 90 | Fill #1

## 2019-01-31 MED FILL — LEVOTHYROXINE 200 MCG TAB: 200 | 84 days supply | Qty: 84 | Fill #0

## 2019-02-08 DIAGNOSIS — E109 Type 1 diabetes mellitus without complications: Secondary | ICD-10-CM | POA: Diagnosis not present

## 2019-02-08 DIAGNOSIS — E10319 Type 1 diabetes mellitus with unspecified diabetic retinopathy without macular edema: Secondary | ICD-10-CM | POA: Diagnosis not present

## 2019-02-16 DIAGNOSIS — E109 Type 1 diabetes mellitus without complications: Secondary | ICD-10-CM | POA: Diagnosis not present

## 2019-02-16 DIAGNOSIS — E10319 Type 1 diabetes mellitus with unspecified diabetic retinopathy without macular edema: Secondary | ICD-10-CM | POA: Diagnosis not present

## 2019-02-23 DIAGNOSIS — C44319 Basal cell carcinoma of skin of other parts of face: Secondary | ICD-10-CM | POA: Diagnosis not present

## 2019-02-23 DIAGNOSIS — D485 Neoplasm of uncertain behavior of skin: Secondary | ICD-10-CM | POA: Diagnosis not present

## 2019-03-04 DIAGNOSIS — C44319 Basal cell carcinoma of skin of other parts of face: Secondary | ICD-10-CM | POA: Diagnosis not present

## 2019-03-07 DIAGNOSIS — H5203 Hypermetropia, bilateral: Secondary | ICD-10-CM | POA: Diagnosis not present

## 2019-03-07 MED FILL — EZETIMIBE 10 MG TABS: 10 | 30 days supply | Qty: 30 | Fill #3

## 2019-03-07 MED FILL — ROSUVASTATIN CALCIUM 10 MG: 10 | 35 days supply | Qty: 15 | Fill #0

## 2019-03-24 DIAGNOSIS — E10319 Type 1 diabetes mellitus with unspecified diabetic retinopathy without macular edema: Secondary | ICD-10-CM | POA: Diagnosis not present

## 2019-03-25 DIAGNOSIS — M810 Age-related osteoporosis without current pathological fracture: Secondary | ICD-10-CM | POA: Diagnosis not present

## 2019-03-25 DIAGNOSIS — E559 Vitamin D deficiency, unspecified: Secondary | ICD-10-CM | POA: Diagnosis not present

## 2019-03-25 DIAGNOSIS — E10319 Type 1 diabetes mellitus with unspecified diabetic retinopathy without macular edema: Secondary | ICD-10-CM | POA: Diagnosis not present

## 2019-03-25 DIAGNOSIS — E785 Hyperlipidemia, unspecified: Secondary | ICD-10-CM | POA: Diagnosis not present

## 2019-03-25 DIAGNOSIS — Z1331 Encounter for screening for depression: Secondary | ICD-10-CM | POA: Diagnosis not present

## 2019-03-25 DIAGNOSIS — E039 Hypothyroidism, unspecified: Secondary | ICD-10-CM | POA: Diagnosis not present

## 2019-03-25 DIAGNOSIS — F320 Major depressive disorder, single episode, mild: Secondary | ICD-10-CM | POA: Diagnosis not present

## 2019-03-25 DIAGNOSIS — Z794 Long term (current) use of insulin: Secondary | ICD-10-CM | POA: Diagnosis not present

## 2019-03-26 ENCOUNTER — Emergency Department (HOSPITAL_BASED_OUTPATIENT_CLINIC_OR_DEPARTMENT_OTHER): Payer: 59

## 2019-03-26 ENCOUNTER — Other Ambulatory Visit: Payer: Self-pay

## 2019-03-26 ENCOUNTER — Encounter (HOSPITAL_BASED_OUTPATIENT_CLINIC_OR_DEPARTMENT_OTHER): Payer: Self-pay | Admitting: Emergency Medicine

## 2019-03-26 ENCOUNTER — Emergency Department (HOSPITAL_BASED_OUTPATIENT_CLINIC_OR_DEPARTMENT_OTHER)
Admission: EM | Admit: 2019-03-26 | Discharge: 2019-03-26 | Disposition: A | Discharge status: Home / Self Care | Payer: 59 | Attending: Emergency Medicine | Admitting: Emergency Medicine

## 2019-03-26 DIAGNOSIS — Y92008 Other place in unspecified non-institutional (private) residence as the place of occurrence of the external cause: Secondary | ICD-10-CM | POA: Diagnosis not present

## 2019-03-26 DIAGNOSIS — E039 Hypothyroidism, unspecified: Secondary | ICD-10-CM | POA: Insufficient documentation

## 2019-03-26 DIAGNOSIS — E119 Type 2 diabetes mellitus without complications: Secondary | ICD-10-CM | POA: Diagnosis not present

## 2019-03-26 DIAGNOSIS — Z87891 Personal history of nicotine dependence: Secondary | ICD-10-CM | POA: Insufficient documentation

## 2019-03-26 DIAGNOSIS — Y999 Unspecified external cause status: Secondary | ICD-10-CM | POA: Insufficient documentation

## 2019-03-26 DIAGNOSIS — Z79899 Other long term (current) drug therapy: Secondary | ICD-10-CM | POA: Diagnosis not present

## 2019-03-26 DIAGNOSIS — S52522A Torus fracture of lower end of left radius, initial encounter for closed fracture: Secondary | ICD-10-CM | POA: Insufficient documentation

## 2019-03-26 DIAGNOSIS — Y9389 Activity, other specified: Secondary | ICD-10-CM | POA: Diagnosis not present

## 2019-03-26 DIAGNOSIS — W11XXXA Fall on and from ladder, initial encounter: Secondary | ICD-10-CM | POA: Diagnosis not present

## 2019-03-26 DIAGNOSIS — S6992XA Unspecified injury of left wrist, hand and finger(s), initial encounter: Secondary | ICD-10-CM | POA: Diagnosis not present

## 2019-03-26 DIAGNOSIS — Z794 Long term (current) use of insulin: Secondary | ICD-10-CM | POA: Diagnosis not present

## 2019-03-26 DIAGNOSIS — M25532 Pain in left wrist: Secondary | ICD-10-CM | POA: Diagnosis not present

## 2019-03-26 DIAGNOSIS — S59912A Unspecified injury of left forearm, initial encounter: Secondary | ICD-10-CM | POA: Diagnosis not present

## 2019-03-26 NOTE — ED Triage Notes (Addendum)
Pt reports she fell 8 feet off of a ladder at app 5pm. C/o L wrist pain. Home splint in place. Denies LOC, neck, back pain

## 2019-03-26 NOTE — ED Provider Notes (Signed)
[facility] EMERGENCY DEPARTMENT Provider Note   CSN: [id number] Arrival date & time: 03/26/19  1821     History Chief Complaint  Patient presents with  . [place] is a 63 y.o. female.  HPI     This patient is a 63 year old female, known history of diabetes on insulin pump, presents after having a fall from an 8 foot ladder where she was hanging lights at her house.  She landed on the ground, she caught herself with her left arm and had acute onset of pain in the left wrist over the radial edge.  She denies any other injuries including her elbow shoulder back neck head or legs.  She has been ambulatory without difficulty and complains of no other pain.  This occurred a short time ago, she has been consistently placing ice on it since that time.  She does endorse a small amount of swelling.  No prior history of fracture of this area.  Past Medical History:  Diagnosis Date  . Cataract    bilaterally removed  . Diabetes mellitus without complication ([place])   . Hypothyroidism   . Shoulder impingement syndrome, right   . Thyroid disease     There are no problems to display for this patient.   Past Surgical History:  Procedure Laterality Date  . COLONOSCOPY  2008-05  . left shoulder surgery    . right elbow surgery    . SHOULDER ARTHROSCOPY WITH SUBACROMIAL DECOMPRESSION Right 09/17/2018   Procedure: SHOULDER ARTHROSCOPY WITH SUBACROMIAL DECOMPRESSION, DEBRIDEMENT;  Surgeon: Melrose Nakayama, MD;  Location: [facility];  Service: Orthopedics;  Laterality: Right;  . SHOULDER CLOSED REDUCTION Right 09/17/2018   Procedure: CLOSED MANIPULATION SHOULDER;  Surgeon: Melrose Nakayama, MD;  Location: [facility];  Service: Orthopedics;  Laterality: Right;     OB History   No obstetric history on file.     Family History  Problem Relation Age of Onset  . Breast cancer Mother   . Breast cancer Maternal Aunt   . Breast cancer  Paternal Aunt   . Pancreatic cancer Maternal Grandfather   . Diabetes Paternal Grandfather   . Colon cancer Neg Hx   . Colon polyps Neg Hx   . Esophageal cancer Neg Hx   . Rectal cancer Neg Hx   . Stomach cancer Neg Hx     Social History   Tobacco Use  . Smoking status: Former Smoker    Quit date: 09/26/2001    Years since quitting: 17.5  . Smokeless tobacco: Never Used  Substance Use Topics  . Alcohol use: Yes    Comment: socially  . Drug use: No    Home Medications Prior to Admission medications   Medication Sig Start Date End Date Taking? Authorizing Provider  Calcium Carbonate-Vit D-Min (CALCIUM 1200 PO) Take 1,200 mg by mouth daily.    [provider]  Cholecalciferol (VITAMIN D3) 5000 units CAPS Take 5,000 Units by mouth daily.    [provider]  HUMALOG 100 UNIT/ML injection  09/04/16   [provider]  ibuprofen (ADVIL,MOTRIN) 800 MG tablet  07/30/16   [provider]  Insulin Human (INSULIN PUMP) 100 unit/ml SOLN Inject into the skin.    [provider]  levothyroxine (LEVOXYL) 175 MCG tablet Take 175 mcg by mouth daily before breakfast.    [provider]  Multiple Vitamin (MULTIVITAMIN) tablet Take 1 tablet by mouth daily.    [provider]  rosuvastatin (CRESTOR) 10 MG tablet  10/07/16   [provider]    Allergies    Patient has no known allergies.  Review of Systems   Review of Systems  Musculoskeletal: Positive for joint swelling.  Skin: Negative for wound.    Physical Exam Updated Vital Signs BP (!) 150/72 (BP Location: Right Arm)   Pulse (!) 111   Temp 98.4 F (36.9 C) (Oral)   Resp 16   Ht 1.651 m (5\' 5" )   Wt 62.6 kg   SpO2 100%   BMI 22.96 kg/m   Physical Exam Vitals and nursing note reviewed.  Constitutional:      General: She is not in acute distress.    Appearance: She is well-developed. She is not diaphoretic.  HENT:     Head: Normocephalic and atraumatic.    Eyes:     General: No scleral icterus.    Conjunctiva/sclera: Conjunctivae normal.  Cardiovascular:     Rate and Rhythm: Normal rate and regular rhythm.  Pulmonary:     Effort: Pulmonary effort is normal.     Breath sounds: Normal breath sounds.  Musculoskeletal:        General: Tenderness present.     Comments: There is tenderness over the wrist dorsally on the radial surface.  She has decreased range of motion secondary to pain.  She is able to make a grip, she is able to range of motion all of her fingers against both extension and flexion against resistance successfully.  She is also able to range her elbow and shoulder without difficulty  Skin:    General: Skin is warm and dry.     Findings: No rash.     Comments: No breaks in the skin, mild bruising present on the dorsal radial aspect of the wrist on the left  Neurological:     Mental Status: She is alert.     Comments: Normal sensation to the left hand other than the left thumb and left index finger which have a slight decrease.  Motor function is normal at the left hand     ED Results / Procedures / Treatments   Labs (all labs ordered are listed, but only abnormal results are displayed) Labs Reviewed - No data to display  EKG None  Radiology DG Forearm Left  Result Date: 03/26/2019 CLINICAL DATA:  [place] from a ladder.  Left wrist pain. EXAM: LEFT FOREARM - 2 VIEW COMPARISON:  None. FINDINGS: No fracture. No bone lesion. Wrist and elbow joints are normally aligned. Soft tissues are unremarkable. IMPRESSION: Negative. Electronically Signed   By: Lajean Manes M.D.   On: 03/26/2019 19:04   DG Wrist Complete Left  Result Date: 03/26/2019 CLINICAL DATA:  Fall from ladder.  Left wrist pain. EXAM: LEFT WRIST - COMPLETE 3+ VIEW COMPARISON:  None. FINDINGS: On the lateral view, there is a cortical disruption projecting along dorsal aspect of the distal radius. Subtle fractures also suggested on the oblique view along the ulnar  aspect of the distal radius. No other evidence of a fracture. The wrist joints are normally spaced and aligned. Soft tissues are unremarkable. IMPRESSION: Probable subtle nondisplaced fracture of the distal radius, along its dorsal ulnar aspect. No other evidence of a fracture. No dislocation. Electronically Signed   By: Lajean Manes M.D.   On: 03/26/2019 19:03    Procedures .Splint Application  Date/Time: 03/26/2019 7:43 PM Performed by: Noemi Chapel, MD Authorized by: Noemi Chapel, MD   Consent:  Consent obtained:  Verbal   Consent given by:  Patient   Risks discussed:  Discoloration, numbness, pain and swelling   Alternatives discussed:  No treatment Pre-procedure details:    Sensation:  Normal Procedure details:    Laterality:  Left   Location:  Wrist   Wrist:  L wrist   Splint type:  Sugar tong   Supplies:  Cotton padding, Ortho-Glass, elastic bandage and sling Post-procedure details:    Pain:  Improved   Sensation:  Normal   Patient tolerance of procedure:  Tolerated well, no immediate complications Comments:         (including critical care time)  Medications Ordered in ED Medications - No data to display  ED Course  I have reviewed the triage vital signs and the nursing notes.  Pertinent labs & imaging results that were available during my care of the patient were reviewed by me and considered in my medical decision making (see chart for details).  Clinical Course as of Mar 26 1943  Sat Mar 26, 2019  1942 I have personally viewed the x-rays of the forearm on the wrist, it does appear that there is a buckle deformity of the distal left radius.  I applied the splint, see the attached note.  The patient declines pain medications for home, stable for discharge.  She already has an orthopedist with whom she would like to follow-up.   [BM]    Clinical Course User Index [BM] Noemi Chapel, MD   MDM Rules/Calculators/A&P  This patient has what appears to be an  injury to the left wrist, possibly fracture, consider distal radial fracture, scaphoid fracture or other wrist fracture.  Ice has been applied on arrival, imaging has been ordered.  The patient is otherwise well and has no signs of head injury or neck pain or neurologic dysfunction other than a slight decrease sensation of the left thumb and index finger.  No motor deficits.   Final Clinical Impression(s) / ED Diagnoses Final diagnoses:  Traumatic closed nondisp torus fracture of distal radial metaphysis, left, initial encounter    Rx / DC Orders ED Discharge Orders    None       Noemi Chapel, MD 03/26/19 1944

## 2019-03-26 NOTE — Discharge Instructions (Signed)
Please wear the sling in the splint until you follow-up with your orthopedist within 1 week.  Seek medical exam for increasing pain numbness weakness or change in color of the fingers.  You may take Tylenol or ibuprofen for the pain, please keep the arm elevated and apply ice intermittently.

## 2019-03-30 DIAGNOSIS — S52532A Colles' fracture of left radius, initial encounter for closed fracture: Secondary | ICD-10-CM | POA: Diagnosis not present

## 2019-04-20 DIAGNOSIS — S52532A Colles' fracture of left radius, initial encounter for closed fracture: Secondary | ICD-10-CM | POA: Diagnosis not present

## 2019-04-29 DIAGNOSIS — E10319 Type 1 diabetes mellitus with unspecified diabetic retinopathy without macular edema: Secondary | ICD-10-CM | POA: Diagnosis not present

## 2019-04-29 DIAGNOSIS — E109 Type 1 diabetes mellitus without complications: Secondary | ICD-10-CM | POA: Diagnosis not present

## 2019-04-29 MED FILL — HumaLOG 100 UNIT/ML SOLN: 100 | 90 days supply | Qty: 90 | Fill #2

## 2019-04-29 MED FILL — EZETIMIBE 10 MG TABS: 10 | 30 days supply | Qty: 30 | Fill #4

## 2019-04-29 MED FILL — ROSUVASTATIN CALCIUM 10 MG: 10 | 35 days supply | Qty: 15 | Fill #1

## 2019-05-02 DIAGNOSIS — E109 Type 1 diabetes mellitus without complications: Secondary | ICD-10-CM | POA: Diagnosis not present

## 2019-05-02 DIAGNOSIS — E10319 Type 1 diabetes mellitus with unspecified diabetic retinopathy without macular edema: Secondary | ICD-10-CM | POA: Diagnosis not present

## 2019-05-02 MED FILL — LEVOTHYROXINE SODIUM 200 MC: 200 | 84 days supply | Qty: 84 | Fill #1

## 2019-05-17 DIAGNOSIS — E10319 Type 1 diabetes mellitus with unspecified diabetic retinopathy without macular edema: Secondary | ICD-10-CM | POA: Diagnosis not present

## 2019-05-17 DIAGNOSIS — E109 Type 1 diabetes mellitus without complications: Secondary | ICD-10-CM | POA: Diagnosis not present

## 2019-05-23 DIAGNOSIS — S52532A Colles' fracture of left radius, initial encounter for closed fracture: Secondary | ICD-10-CM | POA: Diagnosis not present

## 2019-06-27 MED FILL — ROSUVASTATIN CALCIUM 10 MG: 10 | 35 days supply | Qty: 15 | Fill #2

## 2019-06-27 MED FILL — EZETIMIBE 10 MG TABS: 10 | 30 days supply | Qty: 30 | Fill #5

## 2019-07-11 DIAGNOSIS — Z794 Long term (current) use of insulin: Secondary | ICD-10-CM | POA: Diagnosis not present

## 2019-07-11 DIAGNOSIS — N182 Chronic kidney disease, stage 2 (mild): Secondary | ICD-10-CM | POA: Diagnosis not present

## 2019-07-11 DIAGNOSIS — Z4681 Encounter for fitting and adjustment of insulin pump: Secondary | ICD-10-CM | POA: Diagnosis not present

## 2019-07-11 DIAGNOSIS — E10319 Type 1 diabetes mellitus with unspecified diabetic retinopathy without macular edema: Secondary | ICD-10-CM | POA: Diagnosis not present

## 2019-07-20 ENCOUNTER — Other Ambulatory Visit: Payer: Self-pay | Admitting: Obstetrics & Gynecology

## 2019-07-20 DIAGNOSIS — Z1231 Encounter for screening mammogram for malignant neoplasm of breast: Secondary | ICD-10-CM

## 2019-07-22 DIAGNOSIS — E109 Type 1 diabetes mellitus without complications: Secondary | ICD-10-CM | POA: Diagnosis not present

## 2019-07-22 DIAGNOSIS — E10319 Type 1 diabetes mellitus with unspecified diabetic retinopathy without macular edema: Secondary | ICD-10-CM | POA: Diagnosis not present

## 2019-07-25 DIAGNOSIS — D1801 Hemangioma of skin and subcutaneous tissue: Secondary | ICD-10-CM | POA: Diagnosis not present

## 2019-07-25 DIAGNOSIS — L814 Other melanin hyperpigmentation: Secondary | ICD-10-CM | POA: Diagnosis not present

## 2019-07-25 DIAGNOSIS — L905 Scar conditions and fibrosis of skin: Secondary | ICD-10-CM | POA: Diagnosis not present

## 2019-07-25 DIAGNOSIS — L821 Other seborrheic keratosis: Secondary | ICD-10-CM | POA: Diagnosis not present

## 2019-07-25 DIAGNOSIS — Z85828 Personal history of other malignant neoplasm of skin: Secondary | ICD-10-CM | POA: Diagnosis not present

## 2019-07-25 DIAGNOSIS — L820 Inflamed seborrheic keratosis: Secondary | ICD-10-CM | POA: Diagnosis not present

## 2019-07-25 DIAGNOSIS — R208 Other disturbances of skin sensation: Secondary | ICD-10-CM | POA: Diagnosis not present

## 2019-07-25 DIAGNOSIS — D225 Melanocytic nevi of trunk: Secondary | ICD-10-CM | POA: Diagnosis not present

## 2019-07-26 ENCOUNTER — Ambulatory Visit: Payer: 59

## 2019-07-28 DIAGNOSIS — E109 Type 1 diabetes mellitus without complications: Secondary | ICD-10-CM | POA: Diagnosis not present

## 2019-07-28 DIAGNOSIS — E10319 Type 1 diabetes mellitus with unspecified diabetic retinopathy without macular edema: Secondary | ICD-10-CM | POA: Diagnosis not present

## 2019-07-29 DIAGNOSIS — E038 Other specified hypothyroidism: Secondary | ICD-10-CM | POA: Diagnosis not present

## 2019-07-29 DIAGNOSIS — Z1331 Encounter for screening for depression: Secondary | ICD-10-CM | POA: Diagnosis not present

## 2019-07-29 DIAGNOSIS — M810 Age-related osteoporosis without current pathological fracture: Secondary | ICD-10-CM | POA: Diagnosis not present

## 2019-07-29 DIAGNOSIS — E10319 Type 1 diabetes mellitus with unspecified diabetic retinopathy without macular edema: Secondary | ICD-10-CM | POA: Diagnosis not present

## 2019-07-29 DIAGNOSIS — Z794 Long term (current) use of insulin: Secondary | ICD-10-CM | POA: Diagnosis not present

## 2019-07-29 DIAGNOSIS — F320 Major depressive disorder, single episode, mild: Secondary | ICD-10-CM | POA: Diagnosis not present

## 2019-07-29 DIAGNOSIS — E559 Vitamin D deficiency, unspecified: Secondary | ICD-10-CM | POA: Diagnosis not present

## 2019-07-29 DIAGNOSIS — E7849 Other hyperlipidemia: Secondary | ICD-10-CM | POA: Diagnosis not present

## 2019-08-01 MED FILL — HumaLOG 100 UNIT/ML SOLN: 100 | 90 days supply | Qty: 90 | Fill #0

## 2019-08-01 MED FILL — ROSUVASTATIN CALCIUM 10 MG: 10 | 35 days supply | Qty: 15 | Fill #0

## 2019-08-01 MED FILL — LEVOTHYROXINE SODIUM 200 MC: 200 | 84 days supply | Qty: 84 | Fill #2

## 2019-08-01 MED FILL — EZETIMIBE 10 MG TABS: 10 | 30 days supply | Qty: 30 | Fill #6

## 2019-08-10 DIAGNOSIS — E10319 Type 1 diabetes mellitus with unspecified diabetic retinopathy without macular edema: Secondary | ICD-10-CM | POA: Diagnosis not present

## 2019-08-10 DIAGNOSIS — E109 Type 1 diabetes mellitus without complications: Secondary | ICD-10-CM | POA: Diagnosis not present

## 2019-08-16 IMAGING — MR MRI OF THE RIGHT HUMERUS WITHOUT AND WITH CONTRAST
8 series · 38 of 40 positions shown · IV contrast (gadavist)
Comparison: MRI of the right shoulder dated 08/16/2018

CLINICAL DATA: Soft tissue mass of the right upper arm for 3
months.

EXAM:
MRI OF THE RIGHT HUMERUS WITHOUT AND WITH CONTRAST
TECHNIQUE: Multiplanar, multisequence MR imaging of the right upper arm was
performed before and after the administration of intravenous
contrast.
CONTRAST:  7 cc Gadavist

[Series 8: T1 · axial · 4.0mm · 0.70mm/px · z∈[-0,+174]mm · 6 of 38 slices shown (1 of 2)]
[im 1/38]
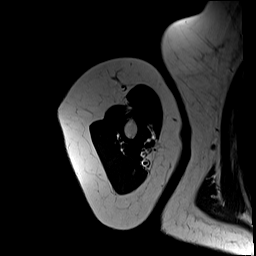
[im 8/38]
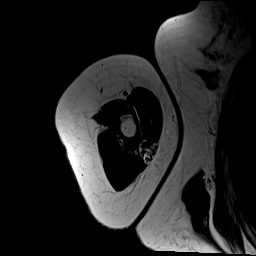
[im 15/38]
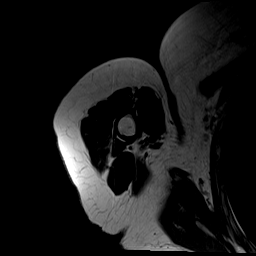
[im 23/38]
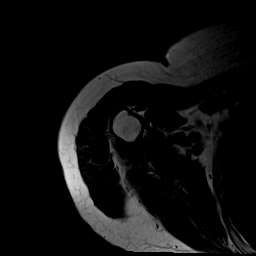
[im 30/38]
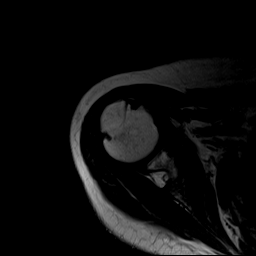
[im 38/38]
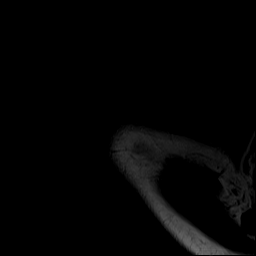

[Series 11: T1 · oblique · 4.0mm · 0.43mm/px · 4 of 24 slices shown (2 of 2)]
[im 1/24]
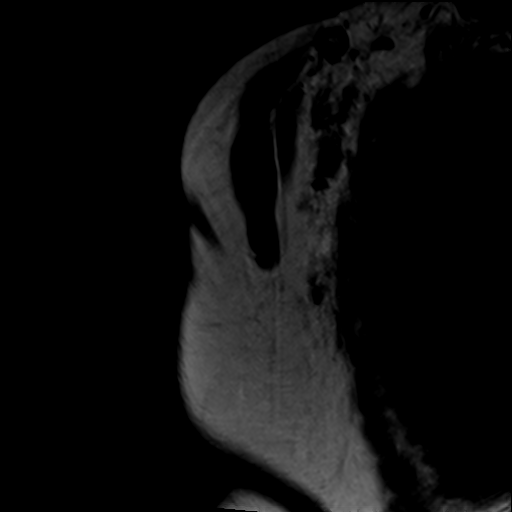
[im 8/24]
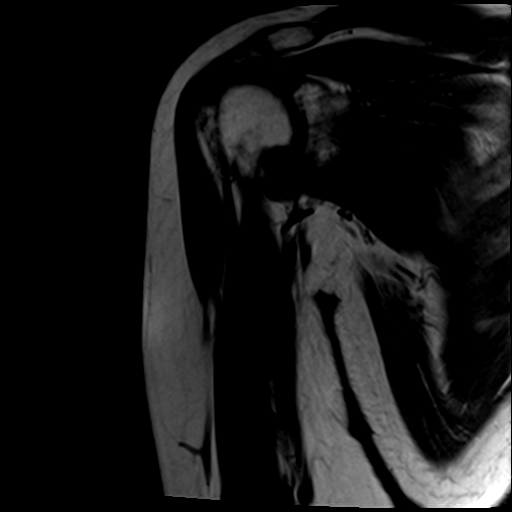
[im 16/24]
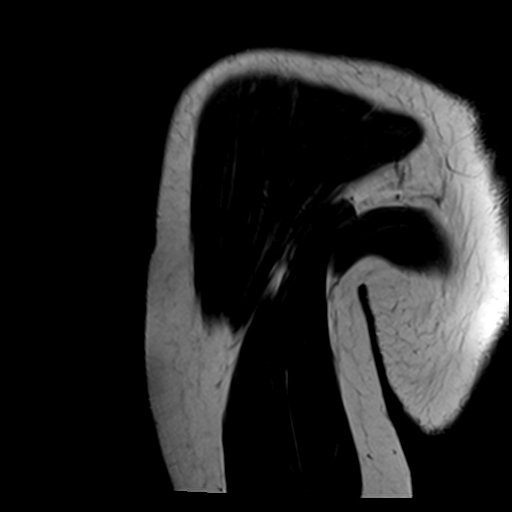
[im 24/24]
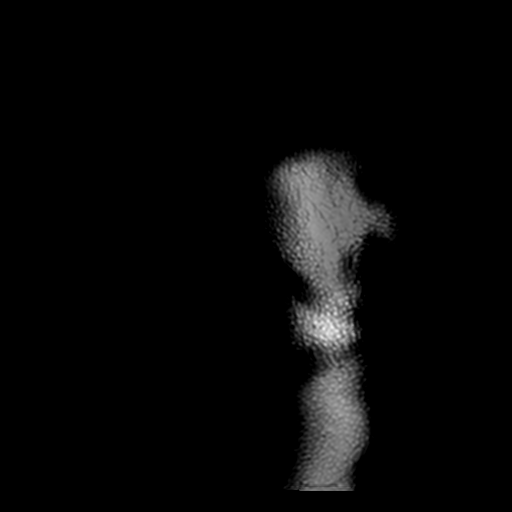

[Series 12: T2 fat-sat · oblique · 4.0mm · 0.72mm/px · 4 of 24 slices shown (1 of 2)]
[im 1/24]
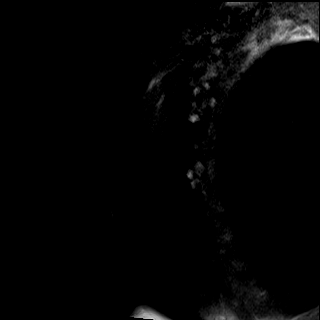
[im 8/24]
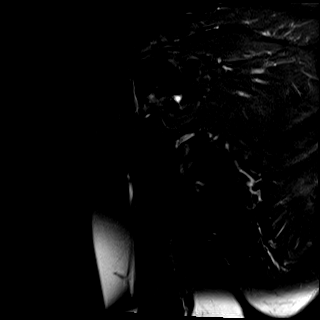
[im 16/24]
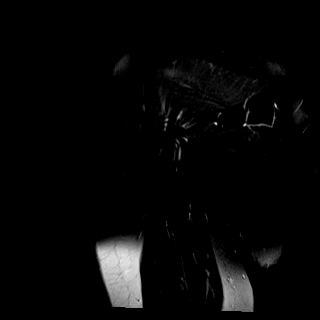
[im 24/24]
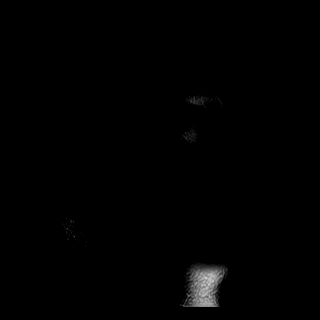

[Series 13: STIR · oblique · 4.0mm · 0.45mm/px · 2 of 26 slices shown]
[im 1/26]
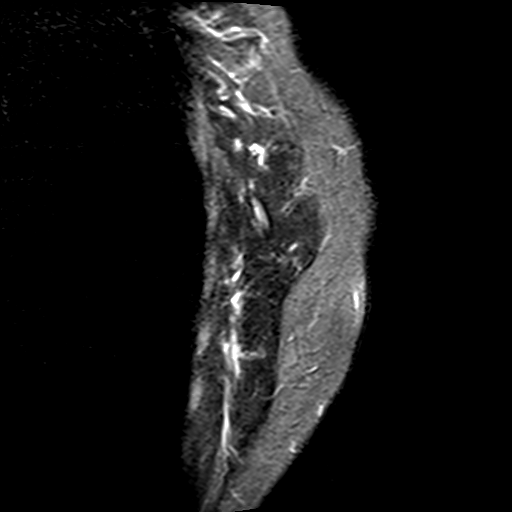
[im 9/26]
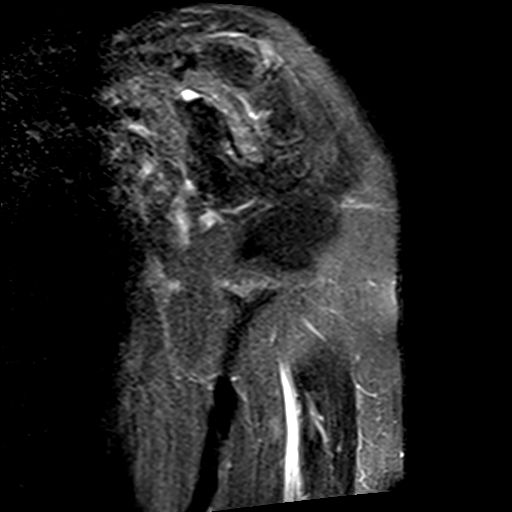

[Series 14: T2 fat-sat · axial · 4.0mm · 0.70mm/px · z∈[-0,+174]mm · 6 of 38 slices shown (2 of 2)]
[im 1/38]
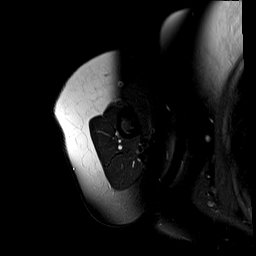
[im 8/38]
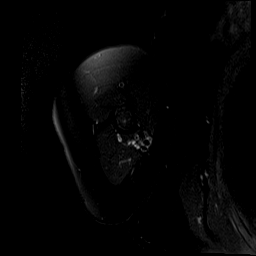
[im 15/38]
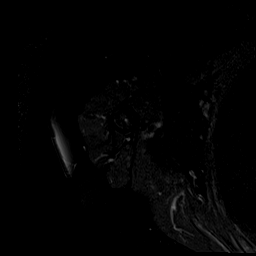
[im 23/38]
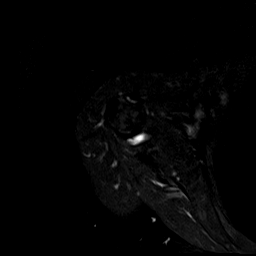
[im 30/38]
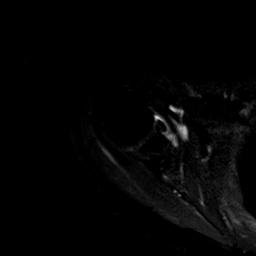
[im 38/38]
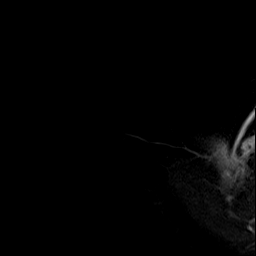

[Series 15: T1 fat-sat · axial · 4.0mm · 0.70mm/px · z∈[-0,+174]mm · 6 of 38 slices shown]
[im 1/38]
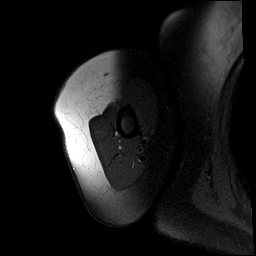
[im 8/38]
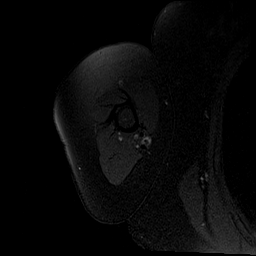
[im 15/38]
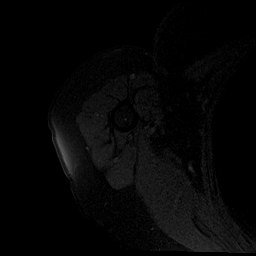
[im 23/38]
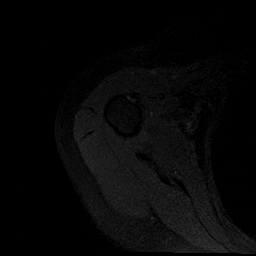
[im 30/38]
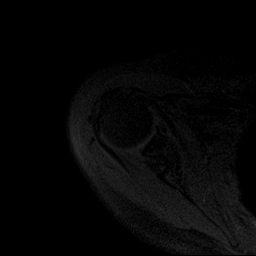
[im 38/38]
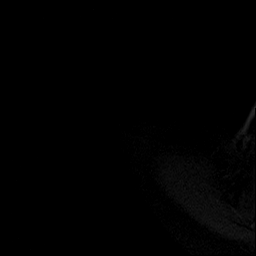

[Series 16: T1 fat-sat post-contrast · axial · 4.0mm · 0.70mm/px · z∈[-0,+174]mm · 6 of 38 slices shown (1 of 2)]
[im 1/38]
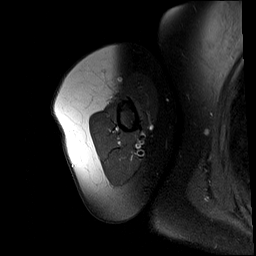
[im 8/38]
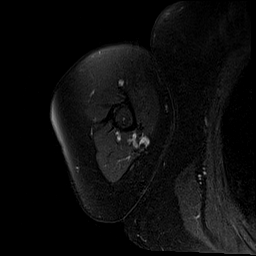
[im 15/38]
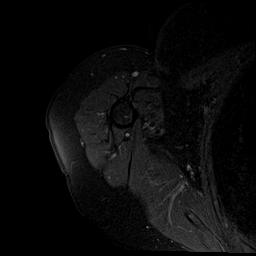
[im 23/38]
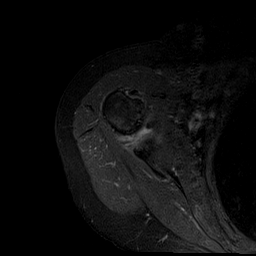
[im 30/38]
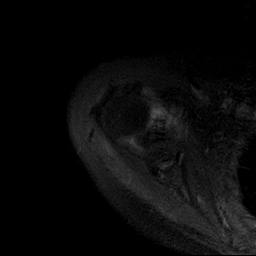
[im 38/38]
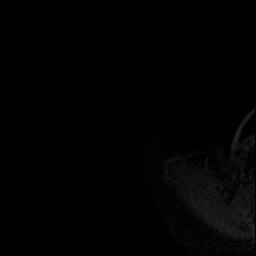

[Series 17: T1 fat-sat post-contrast · oblique · 4.0mm · 0.43mm/px · 4 of 24 slices shown (2 of 2)]
[im 1/24]
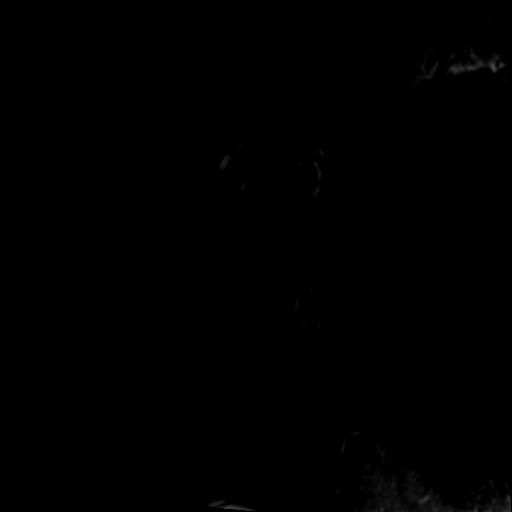
[im 8/24]
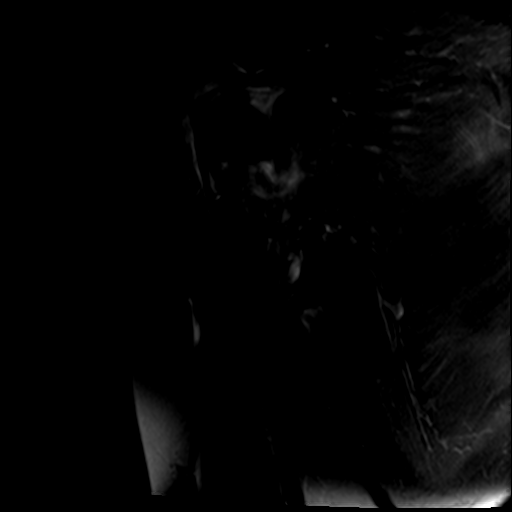
[im 16/24]
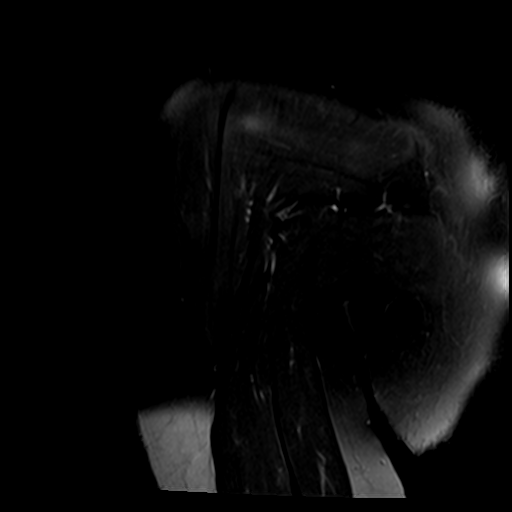
[im 24/24]
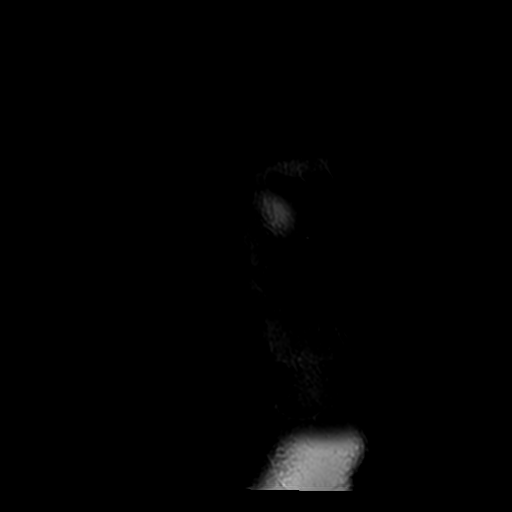

[38 of 40 positions shown; findings below may reference images not displayed]

FINDINGS: Bones/Joint/Cartilage

No significant bone abnormality. Minimal cystic degenerative changes
in the posterior aspect of the greater tuberosity of the proximal
humerus. Small nonspecific glenohumeral joint effusion with some
enhancement of the synovium after contrast administration.

Muscles and Tendons

The muscles and tendons around the shoulder appear normal.
Specifically, the long head of the biceps tendon is properly located
and intact.

Soft tissues

Vitamin-E capsules were placed on the anterolateral aspect of the
right upper arm in the area of the patient's concern. The patient
has prominent but otherwise normal subcutaneous fat in that area.
There is no definable lipoma. There is no mass. No pathologic
enhancement in that area after contrast administration

There is no axillary adenopathy.
IMPRESSION: 1. No evidence of soft tissue mass or abnormality of the muscles or
subcutaneous fat of the right upper. There is prominent subcutaneous
fat in the area of the patient's concern but there is no definable
lipoma.
2. Small glenohumeral joint effusion with slight enhancement of the
synovium suggesting mild synovitis at the glenohumeral joint.

## 2019-08-30 ENCOUNTER — Other Ambulatory Visit: Payer: Self-pay

## 2019-08-30 ENCOUNTER — Ambulatory Visit
Admission: RE | Admit: 2019-08-30 | Discharge: 2019-08-30 | Disposition: A | Discharge status: Home / Self Care | Payer: 59 | Source: Ambulatory Visit | Attending: Obstetrics & Gynecology | Admitting: Obstetrics & Gynecology

## 2019-08-30 DIAGNOSIS — Z1231 Encounter for screening mammogram for malignant neoplasm of breast: Secondary | ICD-10-CM

## 2019-09-06 DIAGNOSIS — L820 Inflamed seborrheic keratosis: Secondary | ICD-10-CM | POA: Diagnosis not present

## 2019-09-06 DIAGNOSIS — R208 Other disturbances of skin sensation: Secondary | ICD-10-CM | POA: Diagnosis not present

## 2019-09-06 MED FILL — ROSUVASTATIN CALCIUM 10 MG: 10 | 35 days supply | Qty: 15 | Fill #1

## 2019-09-08 ENCOUNTER — Other Ambulatory Visit (HOSPITAL_BASED_OUTPATIENT_CLINIC_OR_DEPARTMENT_OTHER): Payer: Self-pay | Admitting: Endocrinology

## 2019-09-08 MED FILL — EZETIMIBE 10 MG TABS: 10 | 30 days supply | Qty: 30 | Fill #0

## 2019-09-20 ENCOUNTER — Other Ambulatory Visit (HOSPITAL_BASED_OUTPATIENT_CLINIC_OR_DEPARTMENT_OTHER): Payer: Self-pay | Admitting: Obstetrics & Gynecology

## 2019-09-20 DIAGNOSIS — M810 Age-related osteoporosis without current pathological fracture: Secondary | ICD-10-CM

## 2019-09-27 ENCOUNTER — Other Ambulatory Visit: Payer: Self-pay

## 2019-09-27 ENCOUNTER — Ambulatory Visit (HOSPITAL_BASED_OUTPATIENT_CLINIC_OR_DEPARTMENT_OTHER)
Admission: RE | Admit: 2019-09-27 | Discharge: 2019-09-27 | Disposition: A | Discharge status: Home / Self Care | Payer: 59 | Source: Ambulatory Visit | Attending: Obstetrics & Gynecology | Admitting: Obstetrics & Gynecology

## 2019-09-27 DIAGNOSIS — M810 Age-related osteoporosis without current pathological fracture: Secondary | ICD-10-CM | POA: Diagnosis not present

## 2019-09-27 DIAGNOSIS — M85851 Other specified disorders of bone density and structure, right thigh: Secondary | ICD-10-CM | POA: Diagnosis not present

## 2019-09-27 DIAGNOSIS — Z780 Asymptomatic menopausal state: Secondary | ICD-10-CM | POA: Diagnosis not present

## 2019-10-06 DIAGNOSIS — Z01419 Encounter for gynecological examination (general) (routine) without abnormal findings: Secondary | ICD-10-CM | POA: Diagnosis not present

## 2019-10-06 DIAGNOSIS — Z6823 Body mass index (BMI) 23.0-23.9, adult: Secondary | ICD-10-CM | POA: Diagnosis not present

## 2019-10-10 ENCOUNTER — Other Ambulatory Visit (HOSPITAL_BASED_OUTPATIENT_CLINIC_OR_DEPARTMENT_OTHER): Payer: Self-pay | Admitting: Endocrinology

## 2019-10-10 MED FILL — EZETIMIBE 10 MG TABS: 10 | 30 days supply | Qty: 30 | Fill #1

## 2019-10-10 MED FILL — LEVOTHYROXINE SODIUM 200 MC: 200 | 90 days supply | Qty: 90 | Fill #0

## 2019-10-10 MED FILL — ROSUVASTATIN CALCIUM 10 MG: 10 | 35 days supply | Qty: 15 | Fill #2

## 2019-12-21 ENCOUNTER — Other Ambulatory Visit (HOSPITAL_COMMUNITY): Payer: Self-pay | Admitting: Endocrinology

## 2019-12-21 MED FILL — EZETIMIBE 10 MG TABS: 10 | 30 days supply | Qty: 30 | Fill #2

## 2019-12-21 MED FILL — ROSUVASTATIN CALCIUM 10 MG: 10 | 35 days supply | Qty: 15 | Fill #0

## 2019-12-22 MED FILL — LEVOTHYROXINE SODIUM 200 MC: 200 | 90 days supply | Qty: 90 | Fill #1

## 2020-03-09 MED FILL — EZETIMIBE 10 MG TABS: 10 | 30 days supply | Qty: 30 | Fill #3

## 2020-03-09 MED FILL — ROSUVASTATIN CALCIUM 10 MG: 10 | 35 days supply | Qty: 15 | Fill #1

## 2020-04-10 MED FILL — EZETIMIBE 10 MG TABS: 10 | 30 days supply | Qty: 30 | Fill #4

## 2020-04-10 MED FILL — ROSUVASTATIN CALCIUM 10 MG: 10 | 35 days supply | Qty: 15 | Fill #2

## 2020-05-18 MED FILL — EZETIMIBE 10 MG TABS: 10 | 30 days supply | Qty: 30 | Fill #5

## 2020-05-18 MED FILL — LEVOTHYROXINE SODIUM 200 MC: 200 | 90 days supply | Qty: 90 | Fill #2

## 2020-05-18 MED FILL — ROSUVASTATIN CALCIUM 10 MG: 10 | 35 days supply | Qty: 15 | Fill #3

## 2020-06-21 MED FILL — EZETIMIBE 10 MG TABS: 10 | 30 days supply | Qty: 30 | Fill #6

## 2020-06-21 MED FILL — ROSUVASTATIN CALCIUM 10 MG: 10 | 35 days supply | Qty: 15 | Fill #4

## 2020-07-10 ENCOUNTER — Other Ambulatory Visit (HOSPITAL_BASED_OUTPATIENT_CLINIC_OR_DEPARTMENT_OTHER): Payer: Self-pay | Admitting: Family Medicine

## 2020-07-10 MED FILL — DOXYCYCLINE HYCLATE 100 MG: 100 | 7 days supply | Qty: 14 | Fill #0

## 2020-07-10 MED FILL — MUPIROCIN 2% OINTMENT: 2 | 7 days supply | Qty: 22 | Fill #0

## 2020-07-17 ENCOUNTER — Other Ambulatory Visit: Payer: Self-pay | Admitting: Obstetrics & Gynecology

## 2020-07-17 DIAGNOSIS — Z1231 Encounter for screening mammogram for malignant neoplasm of breast: Secondary | ICD-10-CM

## 2020-07-23 ENCOUNTER — Other Ambulatory Visit (HOSPITAL_BASED_OUTPATIENT_CLINIC_OR_DEPARTMENT_OTHER): Payer: Self-pay

## 2020-07-23 ENCOUNTER — Other Ambulatory Visit (HOSPITAL_BASED_OUTPATIENT_CLINIC_OR_DEPARTMENT_OTHER): Payer: Self-pay | Admitting: Endocrinology

## 2020-07-23 MED FILL — Rosuvastatin Calcium Tab 10 MG: ORAL | 35 days supply | Qty: 15 | Fill #0 | Status: AC

## 2020-07-25 ENCOUNTER — Other Ambulatory Visit (HOSPITAL_BASED_OUTPATIENT_CLINIC_OR_DEPARTMENT_OTHER): Payer: Self-pay

## 2020-07-25 MED ORDER — EZETIMIBE 10 MG PO TABS
ORAL_TABLET | ORAL | 1 refills | Status: DC
  Filled 2020-07-25: qty 90, 90d supply, fill #0
  Filled 2020-10-26: qty 90, 90d supply, fill #1

## 2020-08-08 ENCOUNTER — Other Ambulatory Visit (HOSPITAL_BASED_OUTPATIENT_CLINIC_OR_DEPARTMENT_OTHER): Payer: Self-pay

## 2020-08-15 ENCOUNTER — Other Ambulatory Visit (HOSPITAL_BASED_OUTPATIENT_CLINIC_OR_DEPARTMENT_OTHER): Payer: Self-pay

## 2020-08-15 MED FILL — Levothyroxine Sodium Tab 200 MCG: ORAL | 90 days supply | Qty: 90 | Fill #0 | Status: CN

## 2020-08-21 ENCOUNTER — Other Ambulatory Visit (HOSPITAL_BASED_OUTPATIENT_CLINIC_OR_DEPARTMENT_OTHER): Payer: Self-pay

## 2020-08-22 ENCOUNTER — Other Ambulatory Visit (HOSPITAL_BASED_OUTPATIENT_CLINIC_OR_DEPARTMENT_OTHER): Payer: Self-pay

## 2020-08-27 ENCOUNTER — Other Ambulatory Visit (HOSPITAL_BASED_OUTPATIENT_CLINIC_OR_DEPARTMENT_OTHER): Payer: Self-pay

## 2020-08-27 MED FILL — Levothyroxine Sodium Tab 200 MCG: ORAL | 90 days supply | Qty: 90 | Fill #0 | Status: AC

## 2020-08-27 MED FILL — Rosuvastatin Calcium Tab 10 MG: ORAL | 35 days supply | Qty: 15 | Fill #1 | Status: AC

## 2020-10-10 ENCOUNTER — Ambulatory Visit
Admission: RE | Admit: 2020-10-10 | Discharge: 2020-10-10 | Disposition: A | Discharge status: Home / Self Care | Payer: Medicare Other | Source: Ambulatory Visit | Attending: Obstetrics & Gynecology | Admitting: Obstetrics & Gynecology

## 2020-10-10 ENCOUNTER — Other Ambulatory Visit: Payer: Self-pay

## 2020-10-10 DIAGNOSIS — Z1231 Encounter for screening mammogram for malignant neoplasm of breast: Secondary | ICD-10-CM

## 2020-10-11 ENCOUNTER — Other Ambulatory Visit: Payer: Self-pay | Admitting: Endocrinology

## 2020-10-11 DIAGNOSIS — R921 Mammographic calcification found on diagnostic imaging of breast: Secondary | ICD-10-CM

## 2020-10-26 ENCOUNTER — Other Ambulatory Visit (HOSPITAL_BASED_OUTPATIENT_CLINIC_OR_DEPARTMENT_OTHER): Payer: Self-pay

## 2020-10-29 ENCOUNTER — Other Ambulatory Visit: Payer: Self-pay

## 2020-10-29 ENCOUNTER — Other Ambulatory Visit: Payer: Self-pay | Admitting: Endocrinology

## 2020-10-29 ENCOUNTER — Ambulatory Visit
Admission: RE | Admit: 2020-10-29 | Discharge: 2020-10-29 | Disposition: A | Discharge status: Home / Self Care | Payer: Medicare Other | Source: Ambulatory Visit | Attending: Endocrinology | Admitting: Endocrinology

## 2020-10-29 DIAGNOSIS — R921 Mammographic calcification found on diagnostic imaging of breast: Secondary | ICD-10-CM

## 2020-10-31 ENCOUNTER — Other Ambulatory Visit (HOSPITAL_BASED_OUTPATIENT_CLINIC_OR_DEPARTMENT_OTHER): Payer: Self-pay

## 2020-10-31 MED ORDER — ROSUVASTATIN CALCIUM 10 MG PO TABS
ORAL_TABLET | ORAL | 6 refills | Status: DC
  Filled 2020-10-31: qty 12, 28d supply, fill #0
  Filled 2020-11-28: qty 12, 28d supply, fill #1

## 2020-11-06 ENCOUNTER — Other Ambulatory Visit: Payer: Self-pay

## 2020-11-06 ENCOUNTER — Ambulatory Visit
Admission: RE | Admit: 2020-11-06 | Discharge: 2020-11-06 | Disposition: A | Discharge status: Home / Self Care | Payer: Medicare Other | Source: Ambulatory Visit | Attending: Endocrinology | Admitting: Endocrinology

## 2020-11-06 DIAGNOSIS — R921 Mammographic calcification found on diagnostic imaging of breast: Secondary | ICD-10-CM

## 2020-11-28 ENCOUNTER — Other Ambulatory Visit (HOSPITAL_BASED_OUTPATIENT_CLINIC_OR_DEPARTMENT_OTHER): Payer: Self-pay

## 2020-11-29 ENCOUNTER — Other Ambulatory Visit (HOSPITAL_BASED_OUTPATIENT_CLINIC_OR_DEPARTMENT_OTHER): Payer: Self-pay

## 2020-11-30 ENCOUNTER — Other Ambulatory Visit (HOSPITAL_BASED_OUTPATIENT_CLINIC_OR_DEPARTMENT_OTHER): Payer: Self-pay

## 2020-12-03 ENCOUNTER — Other Ambulatory Visit (HOSPITAL_BASED_OUTPATIENT_CLINIC_OR_DEPARTMENT_OTHER): Payer: Self-pay

## 2020-12-04 ENCOUNTER — Other Ambulatory Visit (HOSPITAL_BASED_OUTPATIENT_CLINIC_OR_DEPARTMENT_OTHER): Payer: Self-pay

## 2020-12-04 MED ORDER — LEVOTHYROXINE SODIUM 200 MCG PO TABS
200.0000 ug | ORAL_TABLET | Freq: Every day | ORAL | 3 refills | Status: DC
  Filled 2020-12-04 – 2020-12-20 (×2): qty 90, 90d supply, fill #0
  Filled 2021-03-20: qty 90, 90d supply, fill #1
  Filled 2021-08-15: qty 90, 90d supply, fill #2

## 2020-12-07 ENCOUNTER — Other Ambulatory Visit: Payer: Self-pay | Admitting: Surgery

## 2020-12-07 DIAGNOSIS — N6091 Unspecified benign mammary dysplasia of right breast: Secondary | ICD-10-CM

## 2020-12-18 ENCOUNTER — Other Ambulatory Visit (HOSPITAL_BASED_OUTPATIENT_CLINIC_OR_DEPARTMENT_OTHER): Payer: Self-pay

## 2020-12-19 ENCOUNTER — Other Ambulatory Visit (HOSPITAL_COMMUNITY): Payer: Self-pay

## 2020-12-20 ENCOUNTER — Other Ambulatory Visit (HOSPITAL_COMMUNITY): Payer: Self-pay

## 2020-12-20 ENCOUNTER — Other Ambulatory Visit (HOSPITAL_BASED_OUTPATIENT_CLINIC_OR_DEPARTMENT_OTHER): Payer: Self-pay

## 2020-12-25 ENCOUNTER — Other Ambulatory Visit (HOSPITAL_BASED_OUTPATIENT_CLINIC_OR_DEPARTMENT_OTHER): Payer: Self-pay

## 2020-12-25 MED ORDER — ROSUVASTATIN CALCIUM 10 MG PO TABS
ORAL_TABLET | ORAL | 4 refills | Status: DC
  Filled 2020-12-25: qty 36, 84d supply, fill #0
  Filled 2021-01-10: qty 39, 90d supply, fill #0
  Filled 2021-02-13: qty 40, 90d supply, fill #0
  Filled 2021-05-20: qty 40, 90d supply, fill #1
  Filled 2021-08-15: qty 40, 90d supply, fill #2

## 2020-12-26 ENCOUNTER — Other Ambulatory Visit (HOSPITAL_BASED_OUTPATIENT_CLINIC_OR_DEPARTMENT_OTHER): Payer: Self-pay

## 2020-12-26 ENCOUNTER — Other Ambulatory Visit (HOSPITAL_COMMUNITY): Payer: Self-pay

## 2020-12-27 ENCOUNTER — Other Ambulatory Visit (HOSPITAL_BASED_OUTPATIENT_CLINIC_OR_DEPARTMENT_OTHER): Payer: Self-pay

## 2020-12-31 ENCOUNTER — Encounter (HOSPITAL_BASED_OUTPATIENT_CLINIC_OR_DEPARTMENT_OTHER): Payer: Self-pay | Admitting: Surgery

## 2020-12-31 ENCOUNTER — Other Ambulatory Visit (HOSPITAL_COMMUNITY): Payer: Self-pay

## 2020-12-31 ENCOUNTER — Other Ambulatory Visit: Payer: Self-pay

## 2021-01-01 ENCOUNTER — Other Ambulatory Visit (HOSPITAL_COMMUNITY): Payer: Self-pay

## 2021-01-02 ENCOUNTER — Other Ambulatory Visit (HOSPITAL_BASED_OUTPATIENT_CLINIC_OR_DEPARTMENT_OTHER): Payer: Self-pay

## 2021-01-03 ENCOUNTER — Encounter (HOSPITAL_BASED_OUTPATIENT_CLINIC_OR_DEPARTMENT_OTHER)
Admission: RE | Admit: 2021-01-03 | Discharge: 2021-01-03 | Disposition: A | Discharge status: Home / Self Care | Payer: Medicare Other | Source: Ambulatory Visit | Attending: Surgery | Admitting: Surgery

## 2021-01-03 DIAGNOSIS — Z01812 Encounter for preprocedural laboratory examination: Secondary | ICD-10-CM | POA: Diagnosis present

## 2021-01-03 LAB — BASIC METABOLIC PANEL
Anion gap: 9 (ref 5–15)
BUN: 9 mg/dL (ref 8–23)
CO2: 23 mmol/L (ref 22–32)
Calcium: 9.2 mg/dL (ref 8.9–10.3)
Chloride: 102 mmol/L (ref 98–111)
Creatinine, Ser: 0.8 mg/dL (ref 0.44–1.00)
GFR, Estimated: >60 mL/min (ref >60)
Glucose, Bld: 325 mg/dL — ABNORMAL HIGH (ref 70–99)
Potassium: 4.5 mmol/L (ref 3.5–5.1)
Sodium: 134 mmol/L — ABNORMAL LOW (ref 135–145)

## 2021-01-03 NOTE — Progress Notes (Addendum)
Surgical soap given with instructions, pt verbalized understanding.   Glucose-325, Dr. Glennon Mac aware, will do CBG day of surgery.

## 2021-01-07 ENCOUNTER — Other Ambulatory Visit: Payer: Self-pay

## 2021-01-07 ENCOUNTER — Ambulatory Visit
Admission: RE | Admit: 2021-01-07 | Discharge: 2021-01-07 | Disposition: A | Discharge status: Home / Self Care | Payer: Medicare Other | Source: Ambulatory Visit | Attending: Surgery | Admitting: Surgery

## 2021-01-07 DIAGNOSIS — N6091 Unspecified benign mammary dysplasia of right breast: Secondary | ICD-10-CM

## 2021-01-07 NOTE — H&P (Signed)
REFERRING PHYSICIAN: Bonney Aid, MD  PROVIDER: Beverlee Nims, MD  MRN: [id number] DOB: 04-Nov-1955   Subjective   Chief Complaint: Breast Cancer   History of Present Illness: Miranda Shaw is a 65 y.o. female who is seen today as an office consultation at the request of Dr. Forde Dandy for evaluation of Breast Cancer .   She is referred here for evaluation of abnormal calcifications in the right breast. These were found on screening mammography. She underwent a biopsy of this showing atypical lobular hyperplasia as well as fibroadenomatoid changes. She has had no previous problems regarding her breast. She tolerated the biopsy well. She has a significant family history of breast cancer in her mother and maternal aunts. She is diabetic. She has no cardiopulmonary issues. She denies nipple discharge. She has had no previous problems regarding her breast.  Review of Systems: A complete review of systems was obtained from the patient. I have reviewed this information and discussed as appropriate with the patient. See HPI as well for other ROS.  ROS   Medical History: Past Medical History:  Diagnosis Date   Arthritis   Diabetes mellitus without complication (CMS-HCC)   Hyperlipidemia   Thyroid disease   There is no problem list on file for this patient.  Past Surgical History:  Procedure Laterality Date   Elbow Surgery Right 2007   Shoulder Surgery Bilateral  2015 and 2019    No Known Allergies  Current Outpatient Medications on File Prior to Visit  Medication Sig Dispense Refill   INSULIN REGULAR HUMAN INJ Inject subcutaneously   levothyroxine (SYNTHROID) 175 MCG tablet Take by mouth   rosuvastatin (CRESTOR) 10 MG tablet   No current facility-administered medications on file prior to visit.   History reviewed. No pertinent family history.   Social History   Tobacco Use  Smoking Status Former Smoker   Quit date: 2009   Years since quitting: 13.6   Smokeless Tobacco Never Used    Social History   Socioeconomic History   Marital status: Single  Tobacco Use   Smoking status: Former Smoker  Quit date: 2009  Years since quitting: 13.6   Smokeless tobacco: Never Used  Scientific laboratory technician Use: Never used  Substance and Sexual Activity   Alcohol use: Yes   Drug use: Never   Objective:   Vitals:  12/07/20 0926  BP: 112/62  Pulse: 109  Temp: 36.5 C (97.7 F)  SpO2: 98%  Weight: 59.6 kg (131 lb 6.4 oz)  Height: 166.4 cm (5' 5.5")   Body mass index is 21.53 kg/m.  Physical Exam   She appears well on exam  Breasts are normal appearance bilaterally with normal and nipple-areolar complexes. There is no right axillary adenopathy and no palpable breast mass  Labs, Imaging and Diagnostic Testing: I have reviewed her mammograms and pathology results  Assessment and Plan:  Diagnoses and all orders for this visit:  Atypical lobular hyperplasia of breast    I gave her a copy of the pathology results. I reviewed her notes in the electronic medical records. We discussed the pathologic findings in detail. Although they do not see malignancy, given the atypical findings and her strong family history of breast cancer, a right breast lumpectomy is recommended to remove this area. She agrees with this. We next discussed proceeding with a radioactive seed guided right breast lumpectomy. I explained the surgical procedure in detail. We discussed the risks which include but is not limited to bleeding, infection,  the need for further surgery if malignancy is found, cardiopulmonary issues, postoperative recovery, etc. She understands and surgery will be scheduled.

## 2021-01-07 NOTE — Anesthesia Preprocedure Evaluation (Addendum)
Anesthesia Evaluation  Patient identified by MRN, date of birth, ID band Patient awake    Reviewed: Allergy & Precautions, NPO status , Patient's Chart, lab work & pertinent test results  Airway Mallampati: II  TM Distance: >3 FB Neck ROM: Full    Dental  (+) Teeth Intact   Pulmonary neg pulmonary ROS, former smoker,    Pulmonary exam normal        Cardiovascular negative cardio ROS   Rhythm:Regular Rate:Normal  HLD   Neuro/Psych negative neurological ROS  negative psych ROS   GI/Hepatic negative GI ROS, Neg liver ROS,   Endo/Other  diabetes, Type 2, Insulin DependentHypothyroidism   Renal/GU negative Renal ROS  negative genitourinary   Musculoskeletal negative musculoskeletal ROS (+)   Abdominal (+)  Abdomen: soft.    Peds  Hematology negative hematology ROS (+)   Anesthesia Other Findings   Reproductive/Obstetrics                            Anesthesia Physical Anesthesia Plan  ASA: 2  Anesthesia Plan: General   Post-op Pain Management:    Induction: Intravenous  PONV Risk Score and Plan: 3 and Ondansetron, Dexamethasone and Midazolam  Airway Management Planned: LMA  Additional Equipment: None  Intra-op Plan:   Post-operative Plan: Extubation in OR  Informed Consent: I have reviewed the patients History and Physical, chart, labs and discussed the procedure including the risks, benefits and alternatives for the proposed anesthesia with the patient or authorized representative who has indicated his/her understanding and acceptance.     Dental advisory given  Plan Discussed with: CRNA  Anesthesia Plan Comments:        Anesthesia Quick Evaluation

## 2021-01-08 ENCOUNTER — Other Ambulatory Visit: Payer: Self-pay

## 2021-01-08 ENCOUNTER — Ambulatory Visit (HOSPITAL_BASED_OUTPATIENT_CLINIC_OR_DEPARTMENT_OTHER): Payer: Medicare Other | Admitting: Anesthesiology

## 2021-01-08 ENCOUNTER — Encounter (HOSPITAL_BASED_OUTPATIENT_CLINIC_OR_DEPARTMENT_OTHER): Payer: Self-pay | Admitting: Surgery

## 2021-01-08 ENCOUNTER — Other Ambulatory Visit (HOSPITAL_COMMUNITY): Payer: Self-pay

## 2021-01-08 ENCOUNTER — Encounter (HOSPITAL_BASED_OUTPATIENT_CLINIC_OR_DEPARTMENT_OTHER)
Admission: RE | Disposition: A | Discharge status: Home / Self Care | Payer: Self-pay | Source: Home / Self Care | Attending: Surgery

## 2021-01-08 ENCOUNTER — Ambulatory Visit
Admission: RE | Admit: 2021-01-08 | Discharge: 2021-01-08 | Disposition: A | Discharge status: Home / Self Care | Payer: Medicare Other | Source: Ambulatory Visit | Attending: Surgery | Admitting: Surgery

## 2021-01-08 ENCOUNTER — Ambulatory Visit (HOSPITAL_BASED_OUTPATIENT_CLINIC_OR_DEPARTMENT_OTHER)
Admission: RE | Admit: 2021-01-08 | Discharge: 2021-01-08 | Disposition: A | Discharge status: Home / Self Care | Payer: Medicare Other | Attending: Surgery | Admitting: Surgery

## 2021-01-08 DIAGNOSIS — N6021 Fibroadenosis of right breast: Secondary | ICD-10-CM | POA: Diagnosis not present

## 2021-01-08 DIAGNOSIS — E119 Type 2 diabetes mellitus without complications: Secondary | ICD-10-CM | POA: Insufficient documentation

## 2021-01-08 DIAGNOSIS — Z79899 Other long term (current) drug therapy: Secondary | ICD-10-CM | POA: Insufficient documentation

## 2021-01-08 DIAGNOSIS — Z79890 Hormone replacement therapy (postmenopausal): Secondary | ICD-10-CM | POA: Insufficient documentation

## 2021-01-08 DIAGNOSIS — Z87891 Personal history of nicotine dependence: Secondary | ICD-10-CM | POA: Diagnosis not present

## 2021-01-08 DIAGNOSIS — N6091 Unspecified benign mammary dysplasia of right breast: Secondary | ICD-10-CM

## 2021-01-08 DIAGNOSIS — Z794 Long term (current) use of insulin: Secondary | ICD-10-CM | POA: Diagnosis not present

## 2021-01-08 DIAGNOSIS — Z803 Family history of malignant neoplasm of breast: Secondary | ICD-10-CM | POA: Diagnosis not present

## 2021-01-08 HISTORY — PX: BREAST EXCISIONAL BIOPSY: SUR124

## 2021-01-08 HISTORY — PX: BREAST LUMPECTOMY WITH RADIOACTIVE SEED LOCALIZATION: SHX6424

## 2021-01-08 LAB — GLUCOSE, CAPILLARY
Glucose-Capillary: 239 mg/dL — ABNORMAL HIGH (ref 70–99)
Glucose-Capillary: 211 mg/dL — ABNORMAL HIGH (ref 70–99)

## 2021-01-08 SURGERY — BREAST LUMPECTOMY WITH RADIOACTIVE SEED LOCALIZATION
Anesthesia: General | Site: Breast | Laterality: Right

## 2021-01-08 MED ORDER — ONDANSETRON HCL 4 MG/2ML IJ SOLN
INTRAMUSCULAR | Status: AC
  Filled 2021-01-08: qty 2

## 2021-01-08 MED ORDER — PROPOFOL 10 MG/ML IV BOLUS
INTRAVENOUS | Status: DC | PRN
Start: 2021-01-08 — End: 2021-01-08
  Administered 2021-01-08: 120 mg via INTRAVENOUS

## 2021-01-08 MED ORDER — DEXAMETHASONE SODIUM PHOSPHATE 4 MG/ML IJ SOLN
INTRAMUSCULAR | Status: DC | PRN
  Administered 2021-01-08: 4 mg via INTRAVENOUS

## 2021-01-08 MED ORDER — LIDOCAINE 2% (20 MG/ML) 5 ML SYRINGE
INTRAMUSCULAR | Status: DC | PRN
  Administered 2021-01-08: 60 mg via INTRAVENOUS

## 2021-01-08 MED ORDER — CHLORHEXIDINE GLUCONATE CLOTH 2 % EX PADS: 6.0000 | MEDICATED_PAD | Freq: Once | CUTANEOUS | Status: DC

## 2021-01-08 MED ORDER — LIDOCAINE 2% (20 MG/ML) 5 ML SYRINGE
INTRAMUSCULAR | Status: AC
  Filled 2021-01-08: qty 5

## 2021-01-08 MED ORDER — FENTANYL CITRATE (PF) 100 MCG/2ML IJ SOLN
INTRAMUSCULAR | Status: DC | PRN
  Administered 2021-01-08: 25 ug via INTRAVENOUS
  Administered 2021-01-08: 50 ug via INTRAVENOUS

## 2021-01-08 MED ORDER — ACETAMINOPHEN 500 MG PO TABS: 1000.0000 mg | ORAL_TABLET | Freq: Once | ORAL | Status: DC

## 2021-01-08 MED ORDER — PROPOFOL 10 MG/ML IV BOLUS
INTRAVENOUS | Status: AC
  Filled 2021-01-08: qty 20

## 2021-01-08 MED ORDER — MIDAZOLAM HCL 2 MG/2ML IJ SOLN
INTRAMUSCULAR | Status: AC
  Filled 2021-01-08: qty 2

## 2021-01-08 MED ORDER — FENTANYL CITRATE (PF) 100 MCG/2ML IJ SOLN: 25.0000 ug | INTRAMUSCULAR | Status: DC | PRN

## 2021-01-08 MED ORDER — ACETAMINOPHEN 500 MG PO TABS
1000.0000 mg | ORAL_TABLET | ORAL | Status: AC
  Administered 2021-01-08: 1000 mg via ORAL

## 2021-01-08 MED ORDER — FENTANYL CITRATE (PF) 100 MCG/2ML IJ SOLN
INTRAMUSCULAR | Status: AC
  Filled 2021-01-08: qty 2

## 2021-01-08 MED ORDER — PHENYLEPHRINE HCL (PRESSORS) 10 MG/ML IV SOLN
INTRAVENOUS | Status: DC | PRN
  Administered 2021-01-08 (×2): 40 ug via INTRAVENOUS

## 2021-01-08 MED ORDER — ACETAMINOPHEN 500 MG PO TABS
ORAL_TABLET | ORAL | Status: AC
  Filled 2021-01-08: qty 2

## 2021-01-08 MED ORDER — MIDAZOLAM HCL 5 MG/5ML IJ SOLN
INTRAMUSCULAR | Status: DC | PRN
  Administered 2021-01-08: 2 mg via INTRAVENOUS

## 2021-01-08 MED ORDER — PHENYLEPHRINE 40 MCG/ML (10ML) SYRINGE FOR IV PUSH (FOR BLOOD PRESSURE SUPPORT)
PREFILLED_SYRINGE | INTRAVENOUS | Status: AC
  Filled 2021-01-08: qty 10

## 2021-01-08 MED ORDER — ONDANSETRON HCL 4 MG/2ML IJ SOLN
INTRAMUSCULAR | Status: DC | PRN
  Administered 2021-01-08: 4 mg via INTRAVENOUS

## 2021-01-08 MED ORDER — BUPIVACAINE-EPINEPHRINE 0.5% -1:200000 IJ SOLN
INTRAMUSCULAR | Status: DC | PRN
  Administered 2021-01-08: 15 mL

## 2021-01-08 MED ORDER — CEFAZOLIN SODIUM-DEXTROSE 2-4 GM/100ML-% IV SOLN
INTRAVENOUS | Status: AC
  Filled 2021-01-08: qty 100

## 2021-01-08 MED ORDER — CEFAZOLIN SODIUM-DEXTROSE 2-4 GM/100ML-% IV SOLN
2.0000 g | INTRAVENOUS | Status: AC
  Administered 2021-01-08: 2 g via INTRAVENOUS

## 2021-01-08 MED ORDER — TRAMADOL HCL 50 MG PO TABS
50.0000 mg | ORAL_TABLET | Freq: Four times a day (QID) | ORAL | 0 refills | Status: AC | PRN
  Filled 2021-01-08: qty 15, 4d supply, fill #0

## 2021-01-08 MED ORDER — LACTATED RINGERS IV SOLN: INTRAVENOUS | Status: DC

## 2021-01-08 MED ORDER — DEXAMETHASONE SODIUM PHOSPHATE 10 MG/ML IJ SOLN
INTRAMUSCULAR | Status: AC
  Filled 2021-01-08: qty 1

## 2021-01-08 SURGICAL SUPPLY — 35 items
ADH SKN CLS APL DERMABOND .7 (GAUZE/BANDAGES/DRESSINGS) ×1
APL PRP STRL LF DISP 70% ISPRP (MISCELLANEOUS) ×1
BINDER BREAST MEDIUM (GAUZE/BANDAGES/DRESSINGS) ×1 IMPLANT
BLADE SURG 15 STRL LF DISP TIS (BLADE) ×1 IMPLANT
BLADE SURG 15 STRL SS (BLADE) ×2
CHLORAPREP W/TINT 26 (MISCELLANEOUS) ×2 IMPLANT
COVER BACK TABLE 60X90IN (DRAPES) ×2 IMPLANT
COVER MAYO STAND STRL (DRAPES) ×2 IMPLANT
COVER PROBE W GEL 5X96 (DRAPES) ×2 IMPLANT
DERMABOND ADVANCED (GAUZE/BANDAGES/DRESSINGS) ×1
DERMABOND ADVANCED .7 DNX12 (GAUZE/BANDAGES/DRESSINGS) ×1 IMPLANT
DRAPE LAPAROSCOPIC ABDOMINAL (DRAPES) ×2 IMPLANT
DRAPE UTILITY XL STRL (DRAPES) ×2 IMPLANT
ELECT REM PT RETURN 9FT ADLT (ELECTROSURGICAL) ×2
ELECTRODE REM PT RTRN 9FT ADLT (ELECTROSURGICAL) ×1 IMPLANT
GLOVE SURG SIGNA 7.5 PF LTX (GLOVE) ×2 IMPLANT
GLOVE SURG UNDER POLY LF SZ7 (GLOVE) ×1 IMPLANT
GOWN STRL REUS W/ TWL LRG LVL3 (GOWN DISPOSABLE) ×1 IMPLANT
GOWN STRL REUS W/ TWL XL LVL3 (GOWN DISPOSABLE) ×1 IMPLANT
GOWN STRL REUS W/TWL LRG LVL3 (GOWN DISPOSABLE) ×2
GOWN STRL REUS W/TWL XL LVL3 (GOWN DISPOSABLE) ×2
KIT MARKER MARGIN INK (KITS) ×2 IMPLANT
NDL HYPO 25X1 1.5 SAFETY (NEEDLE) ×1 IMPLANT
NEEDLE HYPO 25X1 1.5 SAFETY (NEEDLE) ×2 IMPLANT
NS IRRIG 1000ML POUR BTL (IV SOLUTION) IMPLANT
PACK BASIN DAY SURGERY FS (CUSTOM PROCEDURE TRAY) ×2 IMPLANT
PENCIL SMOKE EVACUATOR (MISCELLANEOUS) ×2 IMPLANT
SLEEVE SCD COMPRESS KNEE MED (STOCKING) ×2 IMPLANT
SPONGE T-LAP 4X18 ~~LOC~~+RFID (SPONGE) ×2 IMPLANT
SUT MNCRL AB 4-0 PS2 18 (SUTURE) ×2 IMPLANT
SUT VIC AB 3-0 SH 27 (SUTURE) ×2
SUT VIC AB 3-0 SH 27X BRD (SUTURE) ×1 IMPLANT
SYR CONTROL 10ML LL (SYRINGE) ×2 IMPLANT
TOWEL GREEN STERILE FF (TOWEL DISPOSABLE) ×2 IMPLANT
TRAY FAXITRON CT DISP (TRAY / TRAY PROCEDURE) ×2 IMPLANT

## 2021-01-08 NOTE — Op Note (Signed)
RIGHT BREAST LUMPECTOMY WITH RADIOACTIVE SEED LOCALIZATION  Procedure Note  Miranda Shaw [birth year]   Pre-op Diagnosis: RIGHT BREAST ATYPICAL LOBULAR HYPERPLASIA     Post-op Diagnosis: Same  Procedure(s): RIGHT BREAST LUMPECTOMY WITH RADIOACTIVE SEED LOCALIZATION  Surgeon(s): Coralie Keens, MD  Anesthesia: General  Staff:  Circulator: Izora Ribas, RN Scrub Person: Lorenza Burton, CST  Estimated Blood Loss: Minimal               Specimens: Sent to pathology  This is a 65 year old female was found to have abnormal calcifications in the upper outer quadrant of the left breast.  She underwent a biopsy showing atypical lobular hyperplasia.  The decision was made to proceed with a radioactive seed guided right breast lumpectomy to remove this area for complete histologic evaluation  Procedure: The patient was brought to operating room identifies a correct patient.  She is placed upon the operating table general anesthesia was induced.  Her right breast was prepped and draped in usual sterile fashion.  The radioactive seed was located in the upper outer quadrant of the right breast deep in the tissue.  I anesthetized the skin in the right upper quadrant with Marcaine and then made a longitudinal incision with a scalpel.  With the aid of neoprobe I then dissected down into the breast tissue with the cautery.  I dissected all the way down to the chest wall which was just underneath the seed.  Again I stayed widely around the seed with the cautery performed a lumpectomy with the aid of the neoprobe.  Once the specimen was removed I then marked all margins with paint.  An x-ray was performed confirming that the radioactive seed and previous biopsy clip were in the specimen.  This was then sent to pathology for evaluation.  I achieved hemostasis with the cautery.  I then closed the subcutaneous tissue with interrupted 3-0 Vicryl sutures and closed the skin with a running 4-0 Monocryl.   Dermabond was then applied.  Patient was next placed in a breast binder.  She tolerated the procedure well.  All the counts were correct at the end of the procedure.  The patient was then extubated in the operating room and taken in stable addition to the recovery room.          Coralie Keens   Date: 01/08/2021  Time: 10:05 AM

## 2021-01-08 NOTE — Interval H&P Note (Signed)
History and Physical Interval Note:no change in h and p  01/08/2021 8:71 AM  Miranda Shaw  has presented today for surgery, with the diagnosis of RIGHT BREAST ATYPICAL LOBULAR HYPERPLASIA.  The various methods of treatment have been discussed with the patient and family. After consideration of risks, benefits and other options for treatment, the patient has consented to  Procedure(s): RIGHT BREAST LUMPECTOMY WITH RADIOACTIVE SEED LOCALIZATION (Right) as a surgical intervention.  The patient's history has been reviewed, patient examined, no change in status, stable for surgery.  I have reviewed the patient's chart and labs.  Questions were answered to the patient's satisfaction.     Coralie Keens

## 2021-01-08 NOTE — Anesthesia Postprocedure Evaluation (Signed)
Anesthesia Post Note  Patient: Miranda Shaw  Procedure(s) Performed: RIGHT BREAST LUMPECTOMY WITH RADIOACTIVE SEED LOCALIZATION (Right: Breast)     Patient location during evaluation: PACU Anesthesia Type: General Level of consciousness: awake and alert Pain management: pain level controlled Vital Signs Assessment: post-procedure vital signs reviewed and stable Respiratory status: spontaneous breathing, nonlabored ventilation, respiratory function stable and patient connected to nasal cannula oxygen Cardiovascular status: blood pressure returned to baseline and stable Postop Assessment: no apparent nausea or vomiting Anesthetic complications: no   No notable events documented.  Last Vitals:  Vitals:   01/08/21 1030 01/08/21 1055  BP: 108/62 (!) 113/56  Pulse: 78 77  Resp: 15 14  Temp:  36.4 C  SpO2: 96% 95%    Last Pain:  Vitals:   01/08/21 1055  TempSrc:   PainSc: 0-No pain                 Belenda Cruise P Lauralynn Loeb

## 2021-01-08 NOTE — Discharge Instructions (Addendum)
Next dose of Tylenol can be given at 2:45pm if needed.   Post Anesthesia Home Care Instructions  Activity: Get plenty of rest for the remainder of the day. A responsible individual must stay with you for 24 hours following the procedure.  For the next 24 hours, DO NOT: -Drive a car -Paediatric nurse -Drink alcoholic beverages -Take any medication unless instructed by your physician -Make any legal decisions or sign important papers.  Meals: Start with liquid foods such as gelatin or soup. Progress to regular foods as tolerated. Avoid greasy, spicy, heavy foods. If nausea and/or vomiting occur, drink only clear liquids until the nausea and/or vomiting subsides. Call your physician if vomiting continues.  Special Instructions/Symptoms: Your throat may feel dry or sore from the anesthesia or the breathing tube placed in your throat during surgery. If this causes discomfort, gargle with warm salt water. The discomfort should disappear within 24 hours.  If you had a scopolamine patch placed behind your ear for the management of post- operative nausea and/or vomiting:  1. The medication in the patch is effective for 72 hours, after which it should be removed.  Wrap patch in a tissue and discard in the trash. Wash hands thoroughly with soap and water. 2. You may remove the patch earlier than 72 hours if you experience unpleasant side effects which may include dry mouth, dizziness or visual disturbances. 3. Avoid touching the patch. Wash your hands with soap and water after contact with the patch.       [place] Office Phone Number [phone]  BREAST BIOPSY/ PARTIAL MASTECTOMY: POST OP INSTRUCTIONS  Always review your discharge instruction sheet given to you by the facility where your surgery was performed.  IF YOU HAVE DISABILITY OR FAMILY LEAVE FORMS, YOU MUST BRING THEM TO THE OFFICE FOR PROCESSING.  DO NOT GIVE THEM TO YOUR DOCTOR.  A prescription for pain  medication may be given to you upon discharge.  Take your pain medication as prescribed, if needed.  If narcotic pain medicine is not needed, then you may take acetaminophen (Tylenol) or ibuprofen (Advil) as needed. Take your usually prescribed medications unless otherwise directed If you need a refill on your pain medication, please contact your pharmacy.  They will contact our office to request authorization.  Prescriptions will not be filled after 5pm or on week-ends. You should eat very light the first 24 hours after surgery, such as soup, crackers, pudding, etc.  Resume your normal diet the day after surgery. Most patients will experience some swelling and bruising in the breast.  Ice packs and a good support bra will help.  Swelling and bruising can take several days to resolve.  It is common to experience some constipation if taking pain medication after surgery.  Increasing fluid intake and taking a stool softener will usually help or prevent this problem from occurring.  A mild laxative (Milk of Magnesia or Miralax) should be taken according to package directions if there are no bowel movements after 48 hours. Unless discharge instructions indicate otherwise, you may remove your bandages 24-48 hours after surgery, and you may shower at that time.  You may have steri-strips (small skin tapes) in place directly over the incision.  These strips should be left on the skin for 7-10 days.  If your surgeon used skin glue on the incision, you may shower in 24 hours.  The glue will flake off over the next 2-3 weeks.  Any sutures or staples will be removed at  the office during your follow-up visit. ACTIVITIES:  You may resume regular daily activities (gradually increasing) beginning the next day.  Wearing a good support bra or sports bra minimizes pain and swelling.  You may have sexual intercourse when it is comfortable. You may drive when you no longer are taking prescription pain medication, you can  comfortably wear a seatbelt, and you can safely maneuver your car and apply brakes. RETURN TO WORK:  ______________________________________________________________________________________ Dennis Bast should see your doctor in the office for a follow-up appointment approximately two weeks after your surgery.  Your doctor's nurse will typically make your follow-up appointment when she calls you with your pathology report.  Expect your pathology report 2-3 business days after your surgery.  You may call to check if you do not hear from [place] after three days. OTHER INSTRUCTIONS: OK TO REMOVE THE BINDER AND SHOWER STARTING TOMORROW ICE PACK, IBUPROFEN, AND TYLENOL FOR PAIN NO VIGOROUS ACTIVITY FOR ONE WEEK _______________________________________________________________________________________________ _____________________________________________________________________________________________________________________________________ _____________________________________________________________________________________________________________________________________ _____________________________________________________________________________________________________________________________________  WHEN TO CALL YOUR DOCTOR: Fever over 101.0 Nausea and/or vomiting. Extreme swelling or bruising. Continued bleeding from incision. Increased pain, redness, or drainage from the incision.  The clinic staff is available to answer your questions during regular business hours.  Please don't hesitate to call and ask to speak to one of the nurses for clinical concerns.  If you have a medical emergency, go to the nearest emergency room or call 911.  A surgeon from [facility] is always on call at the hospital.  For further questions, please visit [email or web address]

## 2021-01-08 NOTE — Transfer of Care (Signed)
Immediate Anesthesia Transfer of Care Note  Patient: Haneen Bernales  Procedure(s) Performed: RIGHT BREAST LUMPECTOMY WITH RADIOACTIVE SEED LOCALIZATION (Right: Breast)  Patient Location: PACU  Anesthesia Type:General  Level of Consciousness: drowsy  Airway & Oxygen Therapy: Patient Spontanous Breathing and Patient connected to face mask oxygen  Post-op Assessment: Report given to RN and Post -op Vital signs reviewed and stable  Post vital signs: Reviewed and stable  Last Vitals:  Vitals Value Taken Time  BP    Temp    Pulse 85 01/08/21 1009  Resp    SpO2 99 % 01/08/21 1009  Vitals shown include unvalidated device data.  Last Pain:  Vitals:   01/08/21 0836  TempSrc: Oral  PainSc: 0-No pain         Complications: No notable events documented.

## 2021-01-08 NOTE — Anesthesia Procedure Notes (Signed)
Procedure Name: LMA Insertion Date/Time: 01/08/2021 9:34 AM Performed by: Ezequiel Kayser, CRNA Pre-anesthesia Checklist: Patient identified, Emergency Drugs available, Suction available and Patient being monitored Patient Re-evaluated:Patient Re-evaluated prior to induction Oxygen Delivery Method: Circle System Utilized Preoxygenation: Pre-oxygenation with 100% oxygen Induction Type: IV induction Ventilation: Mask ventilation without difficulty LMA: LMA inserted LMA Size: 4.0 Number of attempts: 1 Airway Equipment and Method: Bite block Placement Confirmation: positive ETCO2 Tube secured with: Tape Dental Injury: Teeth and Oropharynx as per pre-operative assessment

## 2021-01-09 ENCOUNTER — Other Ambulatory Visit (HOSPITAL_COMMUNITY): Payer: Self-pay

## 2021-01-10 ENCOUNTER — Other Ambulatory Visit (HOSPITAL_BASED_OUTPATIENT_CLINIC_OR_DEPARTMENT_OTHER): Payer: Self-pay

## 2021-01-10 ENCOUNTER — Encounter (HOSPITAL_BASED_OUTPATIENT_CLINIC_OR_DEPARTMENT_OTHER): Payer: Self-pay | Admitting: Surgery

## 2021-01-15 LAB — SURGICAL PATHOLOGY

## 2021-01-21 ENCOUNTER — Other Ambulatory Visit (HOSPITAL_BASED_OUTPATIENT_CLINIC_OR_DEPARTMENT_OTHER): Payer: Self-pay

## 2021-01-29 ENCOUNTER — Telehealth: Payer: Self-pay | Admitting: Hematology and Oncology

## 2021-01-29 NOTE — Telephone Encounter (Signed)
Scheduled appt per 10/14 referral. Pt is aware of appt date and time.

## 2021-02-13 ENCOUNTER — Other Ambulatory Visit (HOSPITAL_BASED_OUTPATIENT_CLINIC_OR_DEPARTMENT_OTHER): Payer: Self-pay

## 2021-02-13 MED ORDER — EZETIMIBE 10 MG PO TABS: ORAL_TABLET | ORAL | 2 refills | Status: DC

## 2021-02-13 MED ORDER — EZETIMIBE 10 MG PO TABS
10.0000 mg | ORAL_TABLET | Freq: Every day | ORAL | 1 refills | Status: DC
  Filled 2021-02-13: qty 90, 90d supply, fill #0
  Filled 2021-05-20: qty 90, 90d supply, fill #1

## 2021-02-16 NOTE — Progress Notes (Signed)
[facility] CONSULT NOTE  Patient Care Team: Reynold Bowen, MD as PCP - General (Endocrinology)  CHIEF COMPLAINTS/PURPOSE OF CONSULTATION:  Newly diagnosed high risk for breast cancer  HISTORY OF PRESENTING ILLNESS:  Miranda Shaw 65 y.o. female is here because of recent diagnosis of high risk of breast cancer. Screening mammogram on 10/10/2020 showed calcifications in the right breast. Diagnostic mammogram and [place] on 10/29/2020 showed 2 cm group of indeterminate calcifications within the upper outer right breast. Right lumpectomy on 01/08/2021 showed atypical lobular hyperplasia and no evidence of malignancy. She presents to the clinic today for initial evaluation and discussion of treatment options.  She has significant family history of breast cancer with her mother and aunts on both sides of the family.  I reviewed her records extensively and collaborated the history with the patient.  MEDICAL HISTORY:  Past Medical History:  Diagnosis Date   Cataract    bilaterally removed   Diabetes mellitus without complication (HCC)    Hypothyroidism    Shoulder impingement syndrome, right    Thyroid disease     SURGICAL HISTORY: Past Surgical History:  Procedure Laterality Date   BREAST LUMPECTOMY WITH RADIOACTIVE SEED LOCALIZATION Right 01/08/2021   Procedure: RIGHT BREAST LUMPECTOMY WITH RADIOACTIVE SEED LOCALIZATION;  Surgeon: Coralie Keens, MD;  Location: [place];  Service: General;  Laterality: Right;   COLONOSCOPY  2008-05   left shoulder surgery     right elbow surgery     SHOULDER ARTHROSCOPY WITH SUBACROMIAL DECOMPRESSION Right 09/17/2018   Procedure: SHOULDER ARTHROSCOPY WITH SUBACROMIAL DECOMPRESSION, DEBRIDEMENT;  Surgeon: Melrose Nakayama, MD;  Location: [place];  Service: Orthopedics;  Laterality: Right;   SHOULDER CLOSED REDUCTION Right 09/17/2018   Procedure: CLOSED MANIPULATION SHOULDER;  Surgeon: Melrose Nakayama,  MD;  Location: [place];  Service: Orthopedics;  Laterality: Right;    SOCIAL HISTORY: Social History   Socioeconomic History   Marital status: Single    Spouse name: Not on file   Number of children: Not on file   Years of education: Not on file   Highest education level: Not on file  Occupational History   Not on file  Tobacco Use   Smoking status: Former    Types: Cigarettes    Quit date: 09/26/2001    Years since quitting: 19.4   Smokeless tobacco: Never  Vaping Use   Vaping Use: Never used  Substance and Sexual Activity   Alcohol use: Yes    Comment: socially   Drug use: No   Sexual activity: Not on file  Other Topics Concern   Not on file  Social History Narrative   Not on file   Social Determinants of Health   Financial Resource Strain: Not on file  Food Insecurity: Not on file  Transportation Needs: Not on file  Physical Activity: Not on file  Stress: Not on file  Social Connections: Not on file  Intimate Partner Violence: Not on file    FAMILY HISTORY: Family History  Problem Relation Age of Onset   Breast cancer Mother    Breast cancer Maternal Aunt    Breast cancer Paternal Aunt    Pancreatic cancer Maternal Grandfather    Diabetes Paternal Grandfather    Colon cancer Neg Hx    Colon polyps Neg Hx    Esophageal cancer Neg Hx    Rectal cancer Neg Hx    Stomach cancer Neg Hx     ALLERGIES:  has No Known  Allergies.  MEDICATIONS:  Current Outpatient Medications  Medication Sig Dispense Refill   Calcium Carbonate 500 MG CHEW Chew 1 tablet (500 mg total) by mouth daily. 90 tablet    tamoxifen (NOLVADEX) 10 MG tablet Take 1 tablet (10 mg total) by mouth daily. 90 tablet 3   Cholecalciferol (VITAMIN D3) 5000 units CAPS Take 5,000 Units by mouth daily.     ezetimibe (ZETIA) 10 MG tablet TAKE 1 TABLET BY MOUTH ONCE DAILY 30 tablet 6   ezetimibe (ZETIA) 10 MG tablet TAKE 1 TABLET BY MOUTH ONCE DAILY 90 tablet 1   ezetimibe (ZETIA) 10  MG tablet TAKE 1 TABLET BY MOUTH ONCE DAILY 90 days 90 tablet 2   HUMALOG 100 UNIT/ML injection   2   ibuprofen (ADVIL,MOTRIN) 800 MG tablet   0   Insulin Human (INSULIN PUMP) 100 unit/ml SOLN Inject into the skin.     levothyroxine (SYNTHROID) 175 MCG tablet Take 175 mcg by mouth daily before breakfast.     levothyroxine (SYNTHROID) 200 MCG tablet TAKE 1 TABLET BY MOUTH ONCE DAILY 90 tablet 3   levothyroxine (SYNTHROID) 200 MCG tablet TAKE 1 TABLET BY MOUTH ONCE DAILY 90 tablet 3   Multiple Vitamin (MULTIVITAMIN) tablet Take 1 tablet by mouth daily.     mupirocin ointment (BACTROBAN) 2 % APPLY 1 APPLICATION EXTERNALLY TWICE A DAY 5 DAY(S) 22 g 0   rosuvastatin (CRESTOR) 10 MG tablet   5   rosuvastatin (CRESTOR) 10 MG tablet TAKE 1 TABLET BY MOUTH THREE TIMES WEEKLY 90 days 40 tablet 4   traMADol (ULTRAM) 50 MG tablet Take 1 tablet (50 mg total) by mouth every 6 (six) hours as needed for moderate pain or severe pain. 15 tablet 0   No current facility-administered medications for this visit.    REVIEW OF SYSTEMS:   As above, all other systems were reviewed with the patient and are negative.  PHYSICAL EXAMINATION: ECOG PERFORMANCE STATUS: 1 - Symptomatic but completely ambulatory  Vitals:   02/18/21 1304  BP: (!) 133/58  Pulse: 88  Resp: 18  Temp: 97.7 F (36.5 C)  SpO2: 98%   Filed Weights   02/18/21 1304  Weight: 137 lb 1.6 oz (62.2 kg)       LABORATORY DATA:  I have reviewed the data as listed No results found for: WBC, HGB, HCT, MCV, PLT Lab Results  Component Value Date   NA 134 (L) 01/03/2021   K 4.5 01/03/2021   CL 102 01/03/2021   CO2 23 01/03/2021    RADIOGRAPHIC STUDIES: I have personally reviewed the radiological reports and agreed with the findings in the report.  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  It appears to increase the risk of breast cancer by 3 fold.  There is risk of both ipsilateral and contralateral breast cancers.  The cumulative incidence of breast cancer is approximately 1 %/year.  Based upon NCI breast cancer risk assessment tool: Risk of breast cancer is 21.7% (average risk 7.4%), 5-year risk 7.6%,  Risk reduction strategies: Recommended not to take hormone replacement therapy. Appropriate lifestyle and dietary changes that include exercise, eating less red meat and increasing fruits and vegetables. Risk reduction with tamoxifen or raloxifene would reduce the risk by half.  Tamoxifen counseling: We discussed the risks and benefits of tamoxifen. These include but  not limited to insomnia, hot flashes, mood changes, vaginal dryness, and weight gain. Although rare, serious side effects including endometrial cancer, risk of blood clots were also discussed. We strongly believe that the benefits far outweigh the risks. Patient understands these risks and consented to starting treatment. Planned treatment duration is 5 years. I recommended starting her on 10 mg daily dosage of tamoxifen.  Breast cancer surveillance: Annual mammograms, consider breast MRI because a lifetime risk of breast cancer risk of more than 20% along with breast exams. 62-month follow-up on tamoxifen Genetic counseling appointment will also be made  All questions were answered. The patient knows to call the clinic with any problems, questions or concerns.   Rulon Eisenmenger, MD, MPH 02/18/2021    I, Thana Ates, am acting as scribe for Nicholas Lose, MD.  I have reviewed the above documentation for accuracy and completeness, and I agree with the above.

## 2021-02-18 ENCOUNTER — Other Ambulatory Visit (HOSPITAL_BASED_OUTPATIENT_CLINIC_OR_DEPARTMENT_OTHER): Payer: Self-pay

## 2021-02-18 ENCOUNTER — Inpatient Hospital Stay: Payer: Medicare Other | Attending: Hematology and Oncology | Admitting: Hematology and Oncology

## 2021-02-18 ENCOUNTER — Other Ambulatory Visit: Payer: Self-pay

## 2021-02-18 DIAGNOSIS — Z79899 Other long term (current) drug therapy: Secondary | ICD-10-CM | POA: Insufficient documentation

## 2021-02-18 DIAGNOSIS — E119 Type 2 diabetes mellitus without complications: Secondary | ICD-10-CM | POA: Diagnosis not present

## 2021-02-18 DIAGNOSIS — Z794 Long term (current) use of insulin: Secondary | ICD-10-CM | POA: Insufficient documentation

## 2021-02-18 DIAGNOSIS — Z79810 Long term (current) use of selective estrogen receptor modulators (SERMs): Secondary | ICD-10-CM | POA: Insufficient documentation

## 2021-02-18 DIAGNOSIS — E039 Hypothyroidism, unspecified: Secondary | ICD-10-CM | POA: Diagnosis not present

## 2021-02-18 DIAGNOSIS — N6022 Fibroadenosis of left breast: Secondary | ICD-10-CM | POA: Insufficient documentation

## 2021-02-18 DIAGNOSIS — N6092 Unspecified benign mammary dysplasia of left breast: Secondary | ICD-10-CM

## 2021-02-18 MED ORDER — TAMOXIFEN CITRATE 10 MG PO TABS
10.0000 mg | ORAL_TABLET | Freq: Every day | ORAL | 3 refills | Status: DC
  Filled 2021-02-18 (×2): qty 90, 90d supply, fill #0
  Filled 2021-05-20: qty 90, 90d supply, fill #1

## 2021-02-18 MED ORDER — CALCIUM CARBONATE 500 MG PO CHEW: 1.0000 | CHEWABLE_TABLET | Freq: Every day | ORAL | Status: DC

## 2021-02-18 NOTE — Assessment & Plan Note (Signed)
01/08/2021: Left lumpectomy: Atypical lobular hyperplasia, sclerosing adenosis Atypical lobular hyperplasia: This is characterized by abnormal cells that are filling part of the lobule.  It appears to increase the risk of breast cancer by 3 fold.  There is risk of both ipsilateral and contralateral breast cancers.  The cumulative incidence of breast cancer is approximately 1 %/year.  Based upon NCI breast cancer risk assessment tool: Risk of breast cancer is  Risk reduction strategies: Recommended not to take hormone replacement therapy. Appropriate lifestyle and dietary changes that include exercise, eating less red meat and increasing fruits and vegetables. Risk reduction with tamoxifen or raloxifene would reduce the risk by half.  Tamoxifen counseling: We discussed the risks and benefits of tamoxifen. These include but not limited to insomnia, hot flashes, mood changes, vaginal dryness, and weight gain. Although rare, serious side effects including endometrial cancer, risk of blood clots were also discussed. We strongly believe that the benefits far outweigh the risks. Patient understands these risks and consented to starting treatment. Planned treatment duration is 5 years.  Breast cancer surveillance: Annual mammograms, consider breast MRI because a lifetime risk of breast cancer risk of more than 20% along with breast exams. 67-month follow-up on tamoxifen

## 2021-02-19 ENCOUNTER — Other Ambulatory Visit (HOSPITAL_BASED_OUTPATIENT_CLINIC_OR_DEPARTMENT_OTHER): Payer: Self-pay

## 2021-02-20 ENCOUNTER — Other Ambulatory Visit (HOSPITAL_BASED_OUTPATIENT_CLINIC_OR_DEPARTMENT_OTHER): Payer: Self-pay

## 2021-03-20 ENCOUNTER — Other Ambulatory Visit (HOSPITAL_BASED_OUTPATIENT_CLINIC_OR_DEPARTMENT_OTHER): Payer: Self-pay

## 2021-03-21 ENCOUNTER — Ambulatory Visit: Payer: Medicare Other | Attending: Internal Medicine

## 2021-03-21 DIAGNOSIS — Z23 Encounter for immunization: Secondary | ICD-10-CM

## 2021-03-21 NOTE — Progress Notes (Signed)
   Covid-19 Vaccination Clinic  Name:  Miranda Shaw    MRN: [id number] DOB: Jun 26, 1955  03/21/2021  Miranda Shaw was observed post Covid-19 immunization for 15 minutes without incident. She was provided with Vaccine Information Sheet and instruction to access the V-Safe system.   Miranda Shaw was instructed to call 911 with Miranda severe reactions post vaccine: Difficulty breathing  Swelling of face and throat  A fast heartbeat  A bad rash all over body  Dizziness and weakness   Immunizations Administered     Name Date Dose VIS Date Route   Moderna Covid-19 vaccine Bivalent Booster 03/21/2021 12:33 PM 0.5 mL 11/24/2020 Intramuscular   Manufacturer: Levan Hurst   Lot: [id number]   [place]: [id number]

## 2021-03-22 ENCOUNTER — Other Ambulatory Visit (HOSPITAL_BASED_OUTPATIENT_CLINIC_OR_DEPARTMENT_OTHER): Payer: Self-pay

## 2021-03-22 MED ORDER — MODERNA COVID-19 BIVAL BOOSTER 50 MCG/0.5ML IM SUSP
INTRAMUSCULAR | 0 refills | Status: DC
  Filled 2021-03-22: qty 0.5, 1d supply, fill #0

## 2021-04-09 ENCOUNTER — Other Ambulatory Visit: Payer: Self-pay | Admitting: Genetic Counselor

## 2021-04-09 DIAGNOSIS — N6092 Unspecified benign mammary dysplasia of left breast: Secondary | ICD-10-CM

## 2021-04-10 ENCOUNTER — Other Ambulatory Visit: Payer: Self-pay

## 2021-04-10 ENCOUNTER — Encounter: Payer: Self-pay | Admitting: Genetic Counselor

## 2021-04-10 ENCOUNTER — Inpatient Hospital Stay: Payer: Medicare Other

## 2021-04-10 ENCOUNTER — Inpatient Hospital Stay: Payer: Medicare Other | Attending: Genetic Counselor | Admitting: Genetic Counselor

## 2021-04-10 DIAGNOSIS — Z8042 Family history of malignant neoplasm of prostate: Secondary | ICD-10-CM

## 2021-04-10 DIAGNOSIS — Z803 Family history of malignant neoplasm of breast: Secondary | ICD-10-CM

## 2021-04-10 DIAGNOSIS — N6092 Unspecified benign mammary dysplasia of left breast: Secondary | ICD-10-CM

## 2021-04-10 DIAGNOSIS — Z800 Family history of malignant neoplasm of digestive organs: Secondary | ICD-10-CM

## 2021-04-10 LAB — GENETIC SCREENING ORDER

## 2021-04-10 NOTE — Progress Notes (Signed)
REFERRING PROVIDER: Nicholas Lose, MD [place],  [place] [id number]  PRIMARY PROVIDER:  Reynold Bowen, MD  PRIMARY REASON FOR VISIT:  1. Family history of breast cancer   2. Family history of pancreatic cancer   3. Family history of prostate cancer   4. 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 female, was seen for a [place] cancer genetics consultation at the request of Dr. Lindi Adie due to a family history of cancer.  Miranda Shaw presents to clinic today to discuss the possibility of a hereditary predisposition to cancer, genetic testing, and to further clarify her future cancer risks, as well as potential cancer risks for family members.   Miranda Shaw is a 65 y.o. female with no personal history of cancer.  She has a diagnosis of ADH, which places her at higher risk for breast cancer.  She reports having a BC a few years ago.  CANCER HISTORY:  Oncology History   No history exists.     RISK FACTORS:  Menarche was at age 18.  First live birth at age N/A.  OCP use for approximately 10 years.  Ovaries intact: yes.  Hysterectomy: no.  Menopausal status: postmenopausal.  HRT use: 0 years. Colonoscopy: yes; normal. Mammogram within the last year: yes. Number of breast biopsies: 1. Up to date with pelvic exams: yes. Any excessive radiation exposure in the past: no  Past Medical History:  Diagnosis Date   Cataract    bilaterally removed   Diabetes mellitus without complication ([place])    Family history of breast cancer    Family history of pancreatic cancer    Family history of prostate cancer    Hypothyroidism    Shoulder impingement syndrome, right    Thyroid disease     Past Surgical History:  Procedure Laterality Date   BREAST LUMPECTOMY WITH RADIOACTIVE SEED LOCALIZATION Right 01/08/2021   Procedure: RIGHT BREAST LUMPECTOMY WITH RADIOACTIVE SEED LOCALIZATION;  Surgeon:  Coralie Keens, MD;  Location: [facility];  Service: General;  Laterality: Right;   COLONOSCOPY  2008-05   left shoulder surgery     right elbow surgery     SHOULDER ARTHROSCOPY WITH SUBACROMIAL DECOMPRESSION Right 09/17/2018   Procedure: SHOULDER ARTHROSCOPY WITH SUBACROMIAL DECOMPRESSION, DEBRIDEMENT;  Surgeon: Melrose Nakayama, MD;  Location: [facility];  Service: Orthopedics;  Laterality: Right;   SHOULDER CLOSED REDUCTION Right 09/17/2018   Procedure: CLOSED MANIPULATION SHOULDER;  Surgeon: Melrose Nakayama, MD;  Location: [facility];  Service: Orthopedics;  Laterality: Right;    Social History   Socioeconomic History   Marital status: Single    Spouse name: Not on file   Number of children: Not on file   Years of education: Not on file   Highest education level: Not on file  Occupational History   Not on file  Tobacco Use   Smoking status: Former    Types: Cigarettes    Quit date: 09/26/2001    Years since quitting: 19.5   Smokeless tobacco: Never  Vaping Use   Vaping Use: Never used  Substance and Sexual Activity   Alcohol use: Yes    Comment: socially   Drug use: No   Sexual activity: Not on file  Other Topics Concern   Not on file  Social History Narrative   Not on file   Social Determinants of Health   Financial Resource  Strain: Not on file  Food Insecurity: Not on file  Transportation Needs: Not on file  Physical Activity: Not on file  Stress: Not on file  Social Connections: Not on file     FAMILY HISTORY:  We obtained a detailed, 4-generation family history.  Significant diagnoses are listed below: Family History  Problem Relation Age of Onset   Breast cancer Mother 61   Lung cancer Father        smoker   Breast cancer Maternal Aunt        dx 71s   Lung cancer Maternal Aunt    Lung cancer Maternal Uncle    Stroke Paternal Aunt    Breast cancer Paternal Aunt        dx 65s   Cancer Maternal Grandmother  78       Gallbladder   Pancreatic cancer Maternal Grandfather 28   Lung cancer Paternal Grandmother        non-smoker   Diabetes Paternal Grandfather    Stroke Paternal Grandfather    Colon cancer Neg Hx    Colon polyps Neg Hx    Esophageal cancer Neg Hx    Rectal cancer Neg Hx    Stomach cancer Neg Hx     The patient does not have children.  She has a brother who is cancer free.  Both parents are deceased.  The patient's mother had breast cancer at 35.  She has three sisters and a brother.  One sister had breast cancer in her 65's, another sister had lung cancer and was a smoker, and her brother had lung cancer and was not a smoker.  The maternal grandparents are both deceased from cancer.  The grandfather had pancreatic cancer and the grandmother had gallbladder cancer.  The patient's father died of lung cancer and he was a smoker.  He had three sisters, one who had breast cancer in her 22's.  His parents are deceased. His father died of diabetes and his mother died of lung cancer.  Miranda Shaw is unaware of previous family history of genetic testing for hereditary cancer risks. Patient's maternal ancestors are of [place] and [place] descent, and paternal ancestors are of [place] and [place] descent. There is no reported Ashkenazi Jewish ancestry. There is no known consanguinity.  GENETIC COUNSELING ASSESSMENT: Miranda Shaw is a 65 y.o. female with a family history of cancer which is somewhat suggestive of a hereditary cancer syndrome and predisposition to cancer given the combination of cancers and young ages of onset. We, therefore, discussed and recommended the following at today's visit.   DISCUSSION: We discussed that, in general, most cancer is not inherited in families, but instead is sporadic or familial. Sporadic cancers occur by chance and typically happen at older ages (>50 years) as this type of cancer is caused by genetic changes acquired during an individuals lifetime. Some  families have more cancers than would be expected by chance; however, the ages or types of cancer are not consistent with a known genetic mutation or known genetic mutations have been ruled out. This type of familial cancer is thought to be due to a combination of multiple genetic, environmental, hormonal, and lifestyle factors. While this combination of factors likely increases the risk of cancer, the exact source of this risk is not currently identifiable or testable.  We discussed that 5 - 10% of breast cancer is hereditary, with most cases associated with BRCA mutations.  There are other genes that can be associated with hereditary breast  cancer syndromes.  These include ATM, CHEK2 and PALB2.  We discussed that testing is beneficial for several reasons including knowing how to follow individuals after completing their treatment, identifying whether potential treatment options such as PARP inhibitors would be beneficial, and understand if other family members could be at risk for cancer and allow them to undergo genetic testing.   We reviewed the characteristics, features and inheritance patterns of hereditary cancer syndromes. We also discussed genetic testing, including the appropriate family members to test, the process of testing, insurance coverage and turn-around-time for results. We discussed the implications of a negative, positive, carrier and/or variant of uncertain significant result. We recommended Miranda Shaw pursue genetic testing for the CancerNext-Expanded+RNAinsight gene panel.   The CancerNext-Expanded gene panel offered by [facility] and includes sequencing and rearrangement analysis for the following 77 genes: AIP, ALK, APC*, ATM*, AXIN2, BAP1, BARD1, BLM, BMPR1A, BRCA1*, BRCA2*, BRIP1*, CDC73, CDH1*, CDK4, CDKN1B, CDKN2A, CHEK2*, CTNNA1, DICER1, FANCC, FH, FLCN, GALNT12, KIF1B, LZTR1, MAX, MEN1, MET, MLH1*, MSH2*, MSH3, MSH6*, MUTYH*, NBN, NF1*, NF2, NTHL1, PALB2*, PHOX2B, PMS2*,  POT1, PRKAR1A, PTCH1, PTEN*, RAD51C*, RAD51D*, RB1, RECQL, RET, SDHA, SDHAF2, SDHB, SDHC, SDHD, SMAD4, SMARCA4, SMARCB1, SMARCE1, STK11, SUFU, TMEM127, TP53*, TSC1, TSC2, VHL and XRCC2 (sequencing and deletion/duplication); EGFR, EGLN1, HOXB13, KIT, MITF, PDGFRA, POLD1, and POLE (sequencing only); EPCAM and GREM1 (deletion/duplication only). DNA and RNA analyses performed for * genes.   Based on Miranda Shaw's family history of cancer, she meets medical criteria for genetic testing, but does not meet Medicare criteria. Despite that she meets criteria, she may still have an out of pocket cost. We discussed that if her out of pocket cost for testing is over $100, the laboratory will call and confirm whether she wants to proceed with testing.  If the out of pocket cost of testing is less than $100 she will be billed by the genetic testing laboratory.   PLAN: After considering the risks, benefits, and limitations, Miranda Shaw provided informed consent to pursue genetic testing and the blood sample was sent to [facility] for analysis of the CancerNext-Expanded+RNAinsight. Results should be available within approximately 2-3 weeks' time, at which point they will be disclosed by telephone to Miranda Shaw, as will any additional recommendations warranted by these results. Miranda Shaw will receive a summary of her genetic counseling visit and a copy of her results once available. This information will also be available in Epic.   Lastly, we encouraged Miranda Shaw to remain in contact with cancer genetics annually so that we can continuously update the family history and inform her of any changes in cancer genetics and testing that may be of benefit for this family.   Miranda Shaw questions were answered to her satisfaction today. Our contact information was provided should additional questions or concerns arise. Thank you for the referral and allowing [place] to share in the care of your patient.   Akaylah Lalley  P. Florene Glen, [place], [facility] Licensed, Insurance risk surveyor [email or web address] phone: [phone]  The patient was seen for a total of 30 minutes in face-to-face genetic counseling.  The patient was seen alone.  This patient was discussed with Drs. Magrinat, Lindi Adie and/or Burr Medico who agrees with the above.    _______________________________________________________________________ For Office Staff:  Number of people involved in session: 1 Was an Intern/ student involved with case: no

## 2021-05-02 ENCOUNTER — Encounter: Payer: Self-pay | Admitting: Genetic Counselor

## 2021-05-02 ENCOUNTER — Ambulatory Visit: Payer: Self-pay | Admitting: Genetic Counselor

## 2021-05-02 ENCOUNTER — Telehealth: Payer: Self-pay | Admitting: Genetic Counselor

## 2021-05-02 DIAGNOSIS — Z1379 Encounter for other screening for genetic and chromosomal anomalies: Secondary | ICD-10-CM

## 2021-05-02 NOTE — Progress Notes (Signed)
HPI:  Ms. Nigh was previously seen in the [facility] clinic due to a personal history of ADH and family history of cancer and concerns regarding a hereditary predisposition to cancer. Please refer to our prior cancer genetics clinic note for more information regarding our discussion, assessment and recommendations, at the time. Ms. Kamath recent genetic test results were disclosed to her, as were recommendations warranted by these results. These results and recommendations are discussed in more detail below.  CANCER HISTORY:  Oncology History   No history exists.    FAMILY HISTORY:  We obtained a detailed, 4-generation family history.  Significant diagnoses are listed below: Family History  Problem Relation Age of Onset   Breast cancer Mother 68   Lung cancer Father        smoker   Breast cancer Maternal Aunt        dx 1s   Lung cancer Maternal Aunt    Lung cancer Maternal Uncle    Stroke Paternal Aunt    Breast cancer Paternal Aunt        dx 15s   Cancer Maternal Grandmother 78       Gallbladder   Pancreatic cancer Maternal Grandfather 8   Lung cancer Paternal Grandmother        non-smoker   Diabetes Paternal Grandfather    Stroke Paternal Grandfather    Colon cancer Neg Hx    Colon polyps Neg Hx    Esophageal cancer Neg Hx    Rectal cancer Neg Hx    Stomach cancer Neg Hx      The patient does not have children.  She has a brother who is cancer free.  Both parents are deceased.   The patient's mother had breast cancer at 43.  She has three sisters and a brother.  One sister had breast cancer in her 10's, another sister had lung cancer and was a smoker, and her brother had lung cancer and was not a smoker.  The maternal grandparents are both deceased from cancer.  The grandfather had pancreatic cancer and the grandmother had gallbladder cancer.   The patient's father died of lung cancer and he was a smoker.  He had three sisters, one who had breast  cancer in her 91's.  His parents are deceased. His father died of diabetes and his mother died of lung cancer.   Ms. Whitehurst is unaware of previous family history of genetic testing for hereditary cancer risks. Patient's maternal ancestors are of [place] and [place] descent, and paternal ancestors are of [place] and [place] descent. There is no reported Ashkenazi Jewish ancestry. There is no known consanguinity.  GENETIC TEST RESULTS: Genetic testing reported out on April 22, 2021 through the CancerNext-Expanded+RNAinsight cancer panel found no pathogenic mutations. The CancerNext-Expanded gene panel offered by [facility] and includes sequencing and rearrangement analysis for the following 77 genes: AIP, ALK, APC*, ATM*, AXIN2, BAP1, BARD1, BLM, BMPR1A, BRCA1*, BRCA2*, BRIP1*, CDC73, CDH1*, CDK4, CDKN1B, CDKN2A, CHEK2*, CTNNA1, DICER1, FANCC, FH, FLCN, GALNT12, KIF1B, LZTR1, MAX, MEN1, MET, MLH1*, MSH2*, MSH3, MSH6*, MUTYH*, NBN, NF1*, NF2, NTHL1, PALB2*, PHOX2B, PMS2*, POT1, PRKAR1A, PTCH1, PTEN*, RAD51C*, RAD51D*, RB1, RECQL, RET, SDHA, SDHAF2, SDHB, SDHC, SDHD, SMAD4, SMARCA4, SMARCB1, SMARCE1, STK11, SUFU, TMEM127, TP53*, TSC1, TSC2, VHL and XRCC2 (sequencing and deletion/duplication); EGFR, EGLN1, HOXB13, KIT, MITF, PDGFRA, POLD1, and POLE (sequencing only); EPCAM and GREM1 (deletion/duplication only). DNA and RNA analyses performed for * genes. The test report has been scanned into EPIC and is located  under the Molecular Pathology section of the Results Review tab.  A portion of the result report is included below for reference.     We discussed with Ms. Dales that because current genetic testing is not perfect, it is possible there may be a gene mutation in one of these genes that current testing cannot detect, but that chance is small.  We also discussed, that there could be another gene that has not yet been discovered, or that we have not yet tested, that is responsible for the cancer  diagnoses in the family. It is also possible there is a hereditary cause for the cancer in the family that Ms. Acero did not inherit and therefore was not identified in her testing.  Therefore, it is important to remain in touch with cancer genetics in the future so that we can continue to offer Ms. Subramanian the most up to date genetic testing.   ADDITIONAL GENETIC TESTING: We discussed with Ms. Barnfield that her genetic testing was fairly extensive.  If there are genes identified to increase cancer risk that can be analyzed in the future, we would be happy to discuss and coordinate this testing at that time.    CANCER SCREENING RECOMMENDATIONS: Ms. Schatz test result is considered negative (normal).  This means that we have not identified a hereditary cause for her personal history of ADH and family history of cancer at this time. Most cancers happen by chance and this negative test suggests that her cancer may fall into this category.    While reassuring, this does not definitively rule out a hereditary predisposition to cancer. It is still possible that there could be genetic mutations that are undetectable by current technology. There could be genetic mutations in genes that have not been tested or identified to increase cancer risk.  Therefore, it is recommended she continue to follow the cancer management and screening guidelines provided by her oncology and primary healthcare provider.   An individual's cancer risk and medical management are not determined by genetic test results alone. Overall cancer risk assessment incorporates additional factors, including personal medical history, family history, and any available genetic information that may result in a personalized plan for cancer prevention and surveillance  RECOMMENDATIONS FOR FAMILY MEMBERS:  Individuals in this family might be at some increased risk of developing cancer, over the general population risk, simply due to the family history  of cancer.  We recommended women in this family have a yearly mammogram beginning at age 72, or 41 years younger than the earliest onset of cancer, an annual clinical breast exam, and perform monthly breast self-exams. Women in this family should also have a gynecological exam as recommended by their primary provider. All family members should be referred for colonoscopy starting at age 54.  FOLLOW-UP: Lastly, we discussed with Ms. Mclarty that cancer genetics is a rapidly advancing field and it is possible that new genetic tests will be appropriate for her and/or her family members in the future. We encouraged her to remain in contact with cancer genetics on an annual basis so we can update her personal and family histories and let her know of advances in cancer genetics that may benefit this family.   Our contact number was provided. Ms. Tamas questions were answered to her satisfaction, and she knows she is welcome to call us at anytime with additional questions or concerns.   Roma Kayser, [place], [facility] Licensed, Certified Genetic Counselor [email or web address]

## 2021-05-02 NOTE — Telephone Encounter (Signed)
Revealed negative genetic testing.  Discussed that we do not know why she has ADH or why there is cancer in the family. It could be due to a different gene that we are not testing, or maybe our current technology may not be able to pick something up.  It will be important for her to keep in contact with genetics to keep up with whether additional testing may be needed.

## 2021-05-20 ENCOUNTER — Other Ambulatory Visit (HOSPITAL_BASED_OUTPATIENT_CLINIC_OR_DEPARTMENT_OTHER): Payer: Self-pay

## 2021-05-20 NOTE — Progress Notes (Signed)
HEMATOLOGY-ONCOLOGY MYCHART VIDEO VISIT PROGRESS NOTE  I connected with Miranda Shaw on 07/15/5954 at 10:00 AM EST by MyChart video conference and verified that I am speaking with the correct person using two identifiers.  I discussed the limitations, risks, security and privacy concerns of performing an evaluation and management service by MyChart and the availability of in person appointments.  I also discussed with the patient that there may be a patient responsible charge related to this service. The patient expressed understanding and agreed to proceed.  Patient's Location: Home Physician Location: Clinic  CHIEF COMPLIANT: Follow-up of high risk for breast cancer  INTERVAL HISTORY: Miranda Shaw is a 66 y.o. female with above-mentioned history of high risk for breast cancer having undergone right lumpectomy. She presents via MyChart today for follow-up.  She is having lots of hot flashes which are driving her nuts.  She has to take her clothes on and off all day.  She is managing the side effects.  Observations/Objective:  There were no vitals filed for this visit. There is no height or weight on file to calculate BMI.  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  It appears to increase the risk of breast cancer by 3 fold.  There is risk of both  ipsilateral and contralateral breast cancers.  The cumulative incidence of breast cancer is approximately 1 %/year.   Based upon NCI breast cancer risk assessment tool: Risk of breast cancer is 21.7% (average risk 7.4%), 5-year risk 7.6%,   Risk reduction strategies: Recommended not to take hormone replacement therapy. Appropriate lifestyle and dietary changes that include exercise, eating less red meat and increasing fruits and vegetables. Risk reduction with tamoxifen or raloxifene would reduce the risk by half Genetic counseling appointment has been made Current treatment: Tamoxifen 10 mg daily started 02/18/2021 reduced to 5 mg daily 05/21/2021   Tamoxifen Toxicities: Severe hot flashes: Recommended reducing the dosage of the tamoxifen to 5 mg daily.  Breast cancer surveillance: Mammograms alternating with MRIs Mammograms to be ordered for July and breast MRI will be ordered for December. Our plan is to do breast MRIs every other year.  Return to clinic in 1 year for follow-up   I discussed the assessment and treatment plan with the patient. The patient was provided an opportunity to ask questions and all were answered. The patient agreed with the plan and demonstrated an understanding of the instructions. The patient was advised to call back or seek an in-person evaluation if the symptoms worsen or if the condition fails to improve as anticipated.   Total time spent: 20 minutes including face-to-face MyChart video visit time and time spent for planning, charting and coordination of care  Rulon Eisenmenger, MD 05/21/2021  I, Thana Ates am acting as scribe for  Nicholas Lose, MD.  I have reviewed the above documentation for accuracy and completeness, and I agree with the above.

## 2021-05-21 ENCOUNTER — Other Ambulatory Visit (HOSPITAL_BASED_OUTPATIENT_CLINIC_OR_DEPARTMENT_OTHER): Payer: Self-pay

## 2021-05-21 ENCOUNTER — Inpatient Hospital Stay: Payer: Medicare Other | Attending: Genetic Counselor | Admitting: Hematology and Oncology

## 2021-05-21 DIAGNOSIS — N6092 Unspecified benign mammary dysplasia of left breast: Secondary | ICD-10-CM

## 2021-05-21 DIAGNOSIS — N6022 Fibroadenosis of left breast: Secondary | ICD-10-CM | POA: Insufficient documentation

## 2021-05-21 NOTE — Assessment & Plan Note (Signed)
01/08/2021: Left lumpectomy: Atypical lobular hyperplasia, sclerosing adenosis Atypical lobular hyperplasia: This is characterized by abnormal cells that are filling part of the lobule.  It appears to increase the risk of breast cancer by 3 fold.  There is risk of both ipsilateral and contralateral breast cancers.  The cumulative incidence of breast cancer is approximately 1 %/year.  Based upon NCI breast cancer risk assessment tool: Risk of breast cancer is 21.7% (average risk 7.4%), 5-year risk 7.6%,  Risk reduction strategies: Recommended not to take hormone replacement therapy. Appropriate lifestyle and dietary changes that include exercise, eating less red meat and increasing fruits and vegetables. Risk reduction with tamoxifen or raloxifene would reduce the risk by half Genetic counseling appointment has been made Current treatment: Tamoxifen 10 mg daily started 02/18/2021  Tamoxifen Toxicities:  Breast cancer surveillance: Mammograms alternating with MRIs Return to clinic in 1 year for follow-up

## 2021-07-31 ENCOUNTER — Encounter (HOSPITAL_COMMUNITY): Payer: Self-pay

## 2021-08-15 ENCOUNTER — Other Ambulatory Visit (HOSPITAL_BASED_OUTPATIENT_CLINIC_OR_DEPARTMENT_OTHER): Payer: Self-pay

## 2021-08-15 ENCOUNTER — Telehealth: Payer: Self-pay

## 2021-08-15 ENCOUNTER — Other Ambulatory Visit: Payer: Self-pay | Admitting: Hematology and Oncology

## 2021-08-15 MED ORDER — EZETIMIBE 10 MG PO TABS
10.0000 mg | ORAL_TABLET | Freq: Every day | ORAL | 12 refills | Status: DC
  Filled 2021-08-15: qty 90, 90d supply, fill #0

## 2021-08-15 MED ORDER — TAMOXIFEN CITRATE 10 MG PO TABS
5.0000 mg | ORAL_TABLET | Freq: Every day | ORAL | 3 refills | Status: DC
  Filled 2021-08-15: qty 50, 100d supply, fill #0

## 2021-08-15 MED ORDER — TAMOXIFEN CITRATE 10 MG PO TABS
5.0000 mg | ORAL_TABLET | Freq: Every day | ORAL | 3 refills | Status: DC
Start: 2021-08-15 — End: 2021-12-03

## 2021-08-15 NOTE — Telephone Encounter (Signed)
Pt called to ask for correction on Tamoxifen. Per MD last note pt will take 5 mg. Pt also asks for phx change as she is unhappy with current and asks for all future rx to go to [facility], Medication changed and [facility] to [facility]  ?

## 2021-08-15 NOTE — Addendum Note (Signed)
Addended by: Lynnae Sandhoff on: 08/15/2021 01:39 PM ? ? Modules accepted: Orders ? ?

## 2021-08-16 ENCOUNTER — Other Ambulatory Visit (HOSPITAL_BASED_OUTPATIENT_CLINIC_OR_DEPARTMENT_OTHER): Payer: Self-pay

## 2021-09-24 ENCOUNTER — Encounter (HOSPITAL_BASED_OUTPATIENT_CLINIC_OR_DEPARTMENT_OTHER): Payer: Self-pay

## 2021-09-24 ENCOUNTER — Other Ambulatory Visit: Payer: Self-pay

## 2021-09-24 ENCOUNTER — Emergency Department (HOSPITAL_BASED_OUTPATIENT_CLINIC_OR_DEPARTMENT_OTHER)
Admission: EM | Admit: 2021-09-24 | Discharge: 2021-09-24 | Disposition: A | Discharge status: Home / Self Care | Payer: Medicare Other | Attending: Emergency Medicine | Admitting: Emergency Medicine

## 2021-09-24 DIAGNOSIS — S81812A Laceration without foreign body, left lower leg, initial encounter: Secondary | ICD-10-CM | POA: Diagnosis not present

## 2021-09-24 DIAGNOSIS — W268XXA Contact with other sharp object(s), not elsewhere classified, initial encounter: Secondary | ICD-10-CM | POA: Insufficient documentation

## 2021-09-24 DIAGNOSIS — Z23 Encounter for immunization: Secondary | ICD-10-CM | POA: Insufficient documentation

## 2021-09-24 DIAGNOSIS — S8992XA Unspecified injury of left lower leg, initial encounter: Secondary | ICD-10-CM | POA: Diagnosis present

## 2021-09-24 MED ORDER — TETANUS-DIPHTH-ACELL PERTUSSIS 5-2.5-18.5 LF-MCG/0.5 IM SUSY
0.5000 mL | PREFILLED_SYRINGE | Freq: Once | INTRAMUSCULAR | Status: AC
  Administered 2021-09-24: 0.5 mL via INTRAMUSCULAR
  Filled 2021-09-24: qty 0.5

## 2021-09-24 MED ORDER — LIDOCAINE-EPINEPHRINE 2 %-1:200000 IJ SOLN
10.0000 mL | Freq: Once | INTRAMUSCULAR | Status: AC
Start: 2021-09-24 — End: 2021-09-24
  Administered 2021-09-24: 10 mL via INTRADERMAL
  Filled 2021-09-24: qty 20

## 2021-09-24 NOTE — Discharge Instructions (Signed)
1. Medications: Tylenol or ibuprofen for pain, usual home medications 2. Treatment: ice for swelling, keep wound clean with warm soap and water and keep bandage dry, do not submerge in water for 24 hours 3. Follow Up: Please return or follow up with primary care provider in 8-10 days to have your stitches/staples removed or sooner if you have concerns. Return to the emergency department for increased redness, drainage of pus from the wound, or fevers/chills.   WOUND CARE  Keep area clean and dry for 24 hours. Do not remove bandage, if applied.  After 24 hours, remove bandage and wash wound gently with mild soap and warm water. Reapply a new bandage after cleaning wound, if directed.   Continue daily cleansing with soap and water until stitches/staples are removed.  Do not apply any ointments or creams to the wound while stitches/staples are in place, as this may cause delayed healing. Return if you experience any of the following signs of infection: Swelling, redness, pus drainage, streaking, fever >101.0 F  Return if you experience excessive bleeding that does not stop after 15-20 minutes of constant, firm pressure.

## 2021-09-24 NOTE — ED Triage Notes (Signed)
Patient POV from Home.  Sustained a 3 cm Laceration to Lower Anterior Left Leg from grazing an Iron Piece.  No Anticoagulants. Tetanus possible updated 7-9 years ago.   NAD Noted during Triage. A&Ox4. GCS 15. Ambulatory.

## 2021-09-24 NOTE — ED Provider Notes (Signed)
[facility] EMERGENCY DEPT Provider Note   CSN: [id number] Arrival date & time: 09/24/21  1937     History  Chief Complaint  Patient presents with   Laceration    Miranda Shaw is a 66 y.o. female who presents to the emergency department for evaluation of a laceration on her left shin that occurred just prior to arrival.  Patient was out in the yard and scraped her shin against a lawn ornament.  She did not realize she was scraped it first and then noticed copious amounts of blood.  She was able to control most of the bleeding prior to arrival.  She is not on blood thinners.  She has no systemic complaints.  Unsure Tdap status.   Laceration      Home Medications Prior to Admission medications   Medication Sig Start Date End Date Taking? Authorizing Provider  Calcium Carbonate 500 MG CHEW Chew 1 tablet (500 mg total) by mouth daily. 02/18/21   Nicholas Lose, MD  Cholecalciferol (VITAMIN D3) 5000 units CAPS Take 5,000 Units by mouth daily.    [provider]  COVID-19 mRNA bivalent vaccine, Moderna, (MODERNA COVID-19 BIVAL BOOSTER) 50 MCG/0.5ML injection Inject into the muscle. 03/21/21   Carlyle Basques, MD  ezetimibe (ZETIA) 10 MG tablet TAKE 1 TABLET BY MOUTH ONCE DAILY 09/08/19 09/07/20  Reynold Bowen, MD  ezetimibe (ZETIA) 10 MG tablet TAKE 1 TABLET BY MOUTH ONCE DAILY 90 days 02/13/21     ezetimibe (ZETIA) 10 MG tablet TAKE 1 TABLET BY MOUTH ONCE DAILY 08/15/21     HUMALOG 100 UNIT/ML injection  09/04/16   [provider]  ibuprofen (ADVIL,MOTRIN) 800 MG tablet  07/30/16   [provider]  Insulin Human (INSULIN PUMP) 100 unit/ml SOLN Inject into the skin.    [provider]  levothyroxine (SYNTHROID) 175 MCG tablet Take 175 mcg by mouth daily before breakfast.    [provider]  levothyroxine (SYNTHROID) 200 MCG tablet TAKE 1 TABLET BY MOUTH ONCE DAILY 10/10/19 11/25/20  Reynold Bowen, MD  levothyroxine (SYNTHROID)  200 MCG tablet TAKE 1 TABLET BY MOUTH ONCE DAILY 12/04/20     Multiple Vitamin (MULTIVITAMIN) tablet Take 1 tablet by mouth daily.    [provider]  rosuvastatin (CRESTOR) 10 MG tablet  10/07/16   [provider]  rosuvastatin (CRESTOR) 10 MG tablet TAKE 1 TABLET BY MOUTH THREE TIMES WEEKLY 90 days 12/25/20     tamoxifen (NOLVADEX) 10 MG tablet Take 0.5 tablets (5 mg total) by mouth daily. 08/15/21   Nicholas Lose, MD  traMADol (ULTRAM) 50 MG tablet Take 1 tablet (50 mg total) by mouth every 6 (six) hours as needed for moderate pain or severe pain. 01/08/21   Coralie Keens, MD      Allergies    Patient has no known allergies.    Review of Systems   Review of Systems  Physical Exam Updated Vital Signs BP (!) 154/85 (BP Location: Right Arm)   Pulse 98   Temp (!) 96.6 F (35.9 C) (Temporal)   Resp 16   Ht 5' 5.5" (1.664 m)   Wt 62.2 kg   SpO2 98%   BMI 22.47 kg/m  Physical Exam Vitals and nursing note reviewed.  Constitutional:      General: She is not in acute distress.    Appearance: She is not ill-appearing.  HENT:     Head: Atraumatic.  Eyes:     Conjunctiva/sclera: Conjunctivae normal.  Cardiovascular:  Rate and Rhythm: Normal rate and regular rhythm.     Pulses: Normal pulses.     Heart sounds: No murmur heard. Pulmonary:     Effort: Pulmonary effort is normal. No respiratory distress.     Breath sounds: Normal breath sounds.  Abdominal:     General: Abdomen is flat. There is no distension.     Palpations: Abdomen is soft.     Tenderness: There is no abdominal tenderness.  Musculoskeletal:        General: Normal range of motion.     Cervical back: Normal range of motion.  Skin:    General: Skin is warm and dry.     Capillary Refill: Capillary refill takes less than 2 seconds.     Comments: 3 cm linear laceration to the left shin, no visible bone.  Neurological:     General: No focal deficit present.     Mental Status: She is alert.   Psychiatric:        Mood and Affect: Mood normal.     ED Results / Procedures / Treatments   Labs (all labs ordered are listed, but only abnormal results are displayed) Labs Reviewed - No data to display  EKG None  Radiology No results found.  Procedures .Marland KitchenLaceration Repair  Date/Time: 09/24/2021 10:11 PM  Performed by: Tonye Pearson, PA-C Authorized by: Tonye Pearson, PA-C   Consent:    Consent obtained:  Verbal   Consent given by:  Patient   Risks discussed:  Infection, need for additional repair, pain, poor cosmetic result and poor wound healing   Alternatives discussed:  No treatment and delayed treatment Universal protocol:    Procedure explained and questions answered to patient or proxy's satisfaction: yes     Relevant documents present and verified: yes     Test results available: yes     Imaging studies available: yes     Required blood products, implants, devices, and special equipment available: yes     Site/side marked: yes     Immediately prior to procedure, a time out was called: yes     Patient identity confirmed:  Verbally with patient Anesthesia:    Anesthesia method:  Local infiltration   Local anesthetic:  Lidocaine 2% WITH epi Laceration details:    Location:  Leg   Leg location:  L lower leg   Length (cm):  3   Depth (mm):  2 Exploration:    Limited defect created (wound extended): no     Contaminated: no   Treatment:    Area cleansed with:  Povidone-iodine   Amount of cleaning:  Standard   Irrigation solution:  Sterile water   Irrigation volume:  12m   Irrigation method:  Syringe   Visualized foreign bodies/material removed: no     Debridement:  None   Undermining:  None   Scar revision: no   Skin repair:    Repair method:  Sutures   Suture size:  4-0   Suture material:  Prolene   Suture technique:  Simple interrupted   Number of sutures:  4 Approximation:    Approximation:  Close Repair type:    Repair type:   Simple Post-procedure details:    Dressing:  Non-adherent dressing   Procedure completion:  Tolerated     Medications Ordered in ED Medications  Tdap (BOOSTRIX) injection 0.5 mL (0.5 mLs Intramuscular Given 09/24/21 2139)  lidocaine-EPINEPHrine (XYLOCAINE W/EPI) 2 %-1:200000 (PF) injection 10 mL (10 mLs Intradermal Given 09/24/21 2142)  ED Course/ Medical Decision Making/ A&P                           Medical Decision Making Risk Prescription drug management.   Pressure irrigation performed. Wound explored and base of wound visualized in a bloodless field without evidence of foreign body.  Laceration occurred < 8 hours prior to repair which was well tolerated.  Tdap updated.  Pt has  no comorbidities to effect normal wound healing. Pt discharged  without antibiotics.  Discussed suture home care with patient and answered questions. Pt to follow-up for wound check and suture removal in 7 days; they are to return to the ED sooner for signs of infection. Pt is hemodynamically stable with no complaints prior to dc.    Final Clinical Impression(s) / ED Diagnoses Final diagnoses:  Laceration of left lower leg, initial encounter    Rx / DC Orders ED Discharge Orders     None         Rodena Piety 09/24/21 2212    Luna Fuse, MD 09/30/21 1654

## 2021-10-03 ENCOUNTER — Other Ambulatory Visit (HOSPITAL_BASED_OUTPATIENT_CLINIC_OR_DEPARTMENT_OTHER): Payer: Self-pay

## 2021-10-11 ENCOUNTER — Ambulatory Visit
Admission: RE | Admit: 2021-10-11 | Discharge: 2021-10-11 | Disposition: A | Discharge status: Home / Self Care | Payer: Medicare Other | Source: Ambulatory Visit | Attending: Hematology and Oncology | Admitting: Hematology and Oncology

## 2021-10-11 DIAGNOSIS — N6092 Unspecified benign mammary dysplasia of left breast: Secondary | ICD-10-CM

## 2021-10-14 ENCOUNTER — Other Ambulatory Visit (HOSPITAL_BASED_OUTPATIENT_CLINIC_OR_DEPARTMENT_OTHER): Payer: Self-pay

## 2021-10-14 MED ORDER — INSULIN LISPRO 100 UNIT/ML IJ SOLN
INTRAMUSCULAR | 0 refills | Status: DC
  Filled 2021-10-14: qty 90, 90d supply, fill #0

## 2021-10-21 ENCOUNTER — Other Ambulatory Visit (HOSPITAL_BASED_OUTPATIENT_CLINIC_OR_DEPARTMENT_OTHER): Payer: Self-pay

## 2021-11-26 ENCOUNTER — Other Ambulatory Visit (HOSPITAL_BASED_OUTPATIENT_CLINIC_OR_DEPARTMENT_OTHER): Payer: Self-pay

## 2021-11-26 MED ORDER — GVOKE HYPOPEN 2-PACK 1 MG/0.2ML ~~LOC~~ SOAJ
SUBCUTANEOUS | 11 refills | Status: DC
  Filled 2021-11-26: qty 0.4, 10d supply, fill #0

## 2021-11-27 ENCOUNTER — Other Ambulatory Visit (HOSPITAL_BASED_OUTPATIENT_CLINIC_OR_DEPARTMENT_OTHER): Payer: Self-pay

## 2021-12-03 ENCOUNTER — Other Ambulatory Visit: Payer: Self-pay

## 2021-12-03 ENCOUNTER — Other Ambulatory Visit (HOSPITAL_BASED_OUTPATIENT_CLINIC_OR_DEPARTMENT_OTHER): Payer: Self-pay

## 2021-12-03 ENCOUNTER — Telehealth: Payer: Self-pay

## 2021-12-03 MED ORDER — TAMOXIFEN CITRATE 10 MG PO TABS
5.0000 mg | ORAL_TABLET | Freq: Every day | ORAL | 3 refills | Status: DC
  Filled 2021-12-03: qty 45, 90d supply, fill #0
  Filled 2022-11-03: qty 45, 90d supply, fill #1

## 2021-12-03 NOTE — Telephone Encounter (Signed)
Patient requested to have Tamoxifen prescription sent to [facility]. Prescription discontinued and resent to desired pharmacy.  Patient aware of the new changes and appreciative of call.

## 2021-12-04 ENCOUNTER — Other Ambulatory Visit (HOSPITAL_BASED_OUTPATIENT_CLINIC_OR_DEPARTMENT_OTHER): Payer: Self-pay

## 2021-12-04 MED ORDER — LEVOTHYROXINE SODIUM 200 MCG PO TABS
200.0000 ug | ORAL_TABLET | ORAL | 4 refills | Status: DC
  Filled 2021-12-04: qty 48, 84d supply, fill #0
  Filled 2022-08-04: qty 57, 100d supply, fill #0
  Filled 2022-08-06: qty 51, 90d supply, fill #0

## 2021-12-04 MED ORDER — INSULIN LISPRO 100 UNIT/ML IJ SOLN
INTRAMUSCULAR | 3 refills | Status: DC
  Filled 2022-03-24: qty 90, 90d supply, fill #0
  Filled 2022-11-03 (×2): qty 90, 90d supply, fill #1

## 2021-12-04 MED ORDER — EZETIMIBE 10 MG PO TABS
10.0000 mg | ORAL_TABLET | Freq: Every day | ORAL | 3 refills | Status: DC
  Filled 2021-12-04 – 2022-03-24 (×2): qty 90, 90d supply, fill #0
  Filled 2022-08-04: qty 90, 90d supply, fill #1
  Filled 2022-11-03: qty 90, 90d supply, fill #2

## 2021-12-04 MED ORDER — TAMOXIFEN CITRATE 10 MG PO TABS
5.0000 mg | ORAL_TABLET | Freq: Every day | ORAL | 3 refills | Status: AC
  Filled 2021-12-04 – 2022-03-24 (×2): qty 45, 90d supply, fill #0
  Filled 2022-08-04: qty 45, 90d supply, fill #1

## 2021-12-04 MED ORDER — ROSUVASTATIN CALCIUM 10 MG PO TABS
ORAL_TABLET | ORAL | 3 refills | Status: DC
  Filled 2021-12-04 (×2): qty 40, 90d supply, fill #0
  Filled 2022-03-24: qty 40, 90d supply, fill #1
  Filled 2022-08-04: qty 40, 90d supply, fill #2
  Filled 2022-11-03: qty 40, 90d supply, fill #3

## 2021-12-09 ENCOUNTER — Other Ambulatory Visit (HOSPITAL_BASED_OUTPATIENT_CLINIC_OR_DEPARTMENT_OTHER): Payer: Self-pay

## 2022-02-12 ENCOUNTER — Other Ambulatory Visit (HOSPITAL_BASED_OUTPATIENT_CLINIC_OR_DEPARTMENT_OTHER): Payer: Self-pay

## 2022-02-12 MED ORDER — SULFAMETHOXAZOLE-TRIMETHOPRIM 800-160 MG PO TABS
1.0000 | ORAL_TABLET | Freq: Two times a day (BID) | ORAL | 0 refills | Status: DC
  Filled 2022-02-12: qty 10, 5d supply, fill #0

## 2022-03-24 ENCOUNTER — Other Ambulatory Visit: Payer: Self-pay

## 2022-03-24 ENCOUNTER — Other Ambulatory Visit (HOSPITAL_BASED_OUTPATIENT_CLINIC_OR_DEPARTMENT_OTHER): Payer: Self-pay

## 2022-03-25 ENCOUNTER — Other Ambulatory Visit (HOSPITAL_BASED_OUTPATIENT_CLINIC_OR_DEPARTMENT_OTHER): Payer: Self-pay

## 2022-03-25 ENCOUNTER — Ambulatory Visit
Admission: RE | Admit: 2022-03-25 | Discharge: 2022-03-25 | Disposition: A | Discharge status: Home / Self Care | Payer: Medicare Other | Source: Ambulatory Visit | Attending: Hematology and Oncology | Admitting: Hematology and Oncology

## 2022-03-25 DIAGNOSIS — N6092 Unspecified benign mammary dysplasia of left breast: Secondary | ICD-10-CM

## 2022-03-25 MED ORDER — GADOPICLENOL 0.5 MMOL/ML IV SOLN
7.0000 mL | Freq: Once | INTRAVENOUS | Status: AC | PRN
  Administered 2022-03-25: 6.5 mL via INTRAVENOUS

## 2022-03-26 ENCOUNTER — Other Ambulatory Visit (HOSPITAL_BASED_OUTPATIENT_CLINIC_OR_DEPARTMENT_OTHER): Payer: Self-pay

## 2022-04-01 ENCOUNTER — Other Ambulatory Visit (HOSPITAL_BASED_OUTPATIENT_CLINIC_OR_DEPARTMENT_OTHER): Payer: Self-pay

## 2022-04-16 ENCOUNTER — Other Ambulatory Visit (HOSPITAL_BASED_OUTPATIENT_CLINIC_OR_DEPARTMENT_OTHER): Payer: Self-pay

## 2022-04-16 MED ORDER — ALENDRONATE SODIUM 70 MG PO TABS
ORAL_TABLET | ORAL | 11 refills | Status: DC
  Filled 2022-04-16: qty 4, 28d supply, fill #0

## 2022-04-23 ENCOUNTER — Other Ambulatory Visit (HOSPITAL_BASED_OUTPATIENT_CLINIC_OR_DEPARTMENT_OTHER): Payer: Self-pay

## 2022-05-27 ENCOUNTER — Other Ambulatory Visit (HOSPITAL_BASED_OUTPATIENT_CLINIC_OR_DEPARTMENT_OTHER): Payer: Self-pay

## 2022-06-06 ENCOUNTER — Other Ambulatory Visit (HOSPITAL_COMMUNITY): Payer: Self-pay

## 2022-06-10 ENCOUNTER — Ambulatory Visit (HOSPITAL_COMMUNITY)
Admission: RE | Admit: 2022-06-10 | Discharge: 2022-06-10 | Disposition: A | Discharge status: Home / Self Care | Payer: Medicare Other | Source: Ambulatory Visit | Attending: Endocrinology | Admitting: Endocrinology

## 2022-06-10 DIAGNOSIS — M810 Age-related osteoporosis without current pathological fracture: Secondary | ICD-10-CM | POA: Diagnosis present

## 2022-06-10 MED ORDER — ZOLEDRONIC ACID 5 MG/100ML IV SOLN: 5.0000 mg | Freq: Once | INTRAVENOUS | Status: AC

## 2022-06-10 MED ORDER — ZOLEDRONIC ACID 5 MG/100ML IV SOLN
INTRAVENOUS | Status: AC
  Administered 2022-06-10: 5 mg via INTRAVENOUS
  Filled 2022-06-10: qty 100

## 2022-08-04 ENCOUNTER — Other Ambulatory Visit (HOSPITAL_BASED_OUTPATIENT_CLINIC_OR_DEPARTMENT_OTHER): Payer: Self-pay

## 2022-08-06 ENCOUNTER — Other Ambulatory Visit (HOSPITAL_BASED_OUTPATIENT_CLINIC_OR_DEPARTMENT_OTHER): Payer: Self-pay

## 2022-08-06 MED ORDER — LEVOTHYROXINE SODIUM 200 MCG PO TABS
ORAL_TABLET | ORAL | 4 refills | Status: DC
  Filled 2022-08-06: qty 48, 84d supply, fill #0
  Filled 2022-08-08 – 2022-11-03 (×2): qty 90, 90d supply, fill #0
  Filled 2023-02-15: qty 48, 84d supply, fill #1

## 2022-08-07 ENCOUNTER — Other Ambulatory Visit: Payer: Self-pay

## 2022-08-07 ENCOUNTER — Other Ambulatory Visit (HOSPITAL_COMMUNITY): Payer: Self-pay

## 2022-08-07 ENCOUNTER — Other Ambulatory Visit (HOSPITAL_BASED_OUTPATIENT_CLINIC_OR_DEPARTMENT_OTHER): Payer: Self-pay

## 2022-08-08 ENCOUNTER — Other Ambulatory Visit (HOSPITAL_BASED_OUTPATIENT_CLINIC_OR_DEPARTMENT_OTHER): Payer: Self-pay

## 2022-08-29 ENCOUNTER — Other Ambulatory Visit: Payer: Self-pay | Admitting: Hematology and Oncology

## 2022-08-29 DIAGNOSIS — Z1231 Encounter for screening mammogram for malignant neoplasm of breast: Secondary | ICD-10-CM

## 2022-10-13 ENCOUNTER — Ambulatory Visit
Admission: RE | Admit: 2022-10-13 | Discharge: 2022-10-13 | Disposition: A | Discharge status: Home / Self Care | Payer: Medicare Other | Source: Ambulatory Visit | Attending: Hematology and Oncology | Admitting: Hematology and Oncology

## 2022-10-13 DIAGNOSIS — Z1231 Encounter for screening mammogram for malignant neoplasm of breast: Secondary | ICD-10-CM

## 2022-11-03 ENCOUNTER — Other Ambulatory Visit (HOSPITAL_BASED_OUTPATIENT_CLINIC_OR_DEPARTMENT_OTHER): Payer: Self-pay

## 2022-11-04 ENCOUNTER — Other Ambulatory Visit (HOSPITAL_BASED_OUTPATIENT_CLINIC_OR_DEPARTMENT_OTHER): Payer: Self-pay

## 2022-11-27 ENCOUNTER — Other Ambulatory Visit: Payer: Self-pay

## 2022-11-27 ENCOUNTER — Other Ambulatory Visit (HOSPITAL_BASED_OUTPATIENT_CLINIC_OR_DEPARTMENT_OTHER): Payer: Self-pay

## 2022-11-27 MED ORDER — AMOXICILLIN 500 MG PO CAPS
ORAL_CAPSULE | ORAL | 1 refills | Status: AC
  Filled 2022-11-27: qty 30, 7d supply, fill #0

## 2022-11-27 MED ORDER — CHLORHEXIDINE GLUCONATE 0.12 % MT SOLN
OROMUCOSAL | 0 refills | Status: AC
  Filled 2022-11-27: qty 473, 15d supply, fill #0

## 2022-12-03 ENCOUNTER — Ambulatory Visit: Payer: Medicare Other | Admitting: Podiatry

## 2022-12-26 ENCOUNTER — Other Ambulatory Visit (HOSPITAL_BASED_OUTPATIENT_CLINIC_OR_DEPARTMENT_OTHER): Payer: Self-pay

## 2022-12-26 MED ORDER — NITROFURANTOIN MONOHYD MACRO 100 MG PO CAPS
100.0000 mg | ORAL_CAPSULE | Freq: Two times a day (BID) | ORAL | 0 refills | Status: DC
  Filled 2022-12-26 (×2): qty 10, 5d supply, fill #0

## 2023-02-15 ENCOUNTER — Other Ambulatory Visit (HOSPITAL_BASED_OUTPATIENT_CLINIC_OR_DEPARTMENT_OTHER): Payer: Self-pay

## 2023-02-15 ENCOUNTER — Other Ambulatory Visit: Payer: Self-pay | Admitting: Hematology and Oncology

## 2023-02-16 ENCOUNTER — Other Ambulatory Visit: Payer: Self-pay

## 2023-02-16 ENCOUNTER — Other Ambulatory Visit (HOSPITAL_BASED_OUTPATIENT_CLINIC_OR_DEPARTMENT_OTHER): Payer: Self-pay

## 2023-02-16 MED ORDER — EZETIMIBE 10 MG PO TABS
10.0000 mg | ORAL_TABLET | Freq: Every day | ORAL | 4 refills | Status: DC
  Filled 2023-02-16: qty 90, 90d supply, fill #0

## 2023-02-16 MED ORDER — ROSUVASTATIN CALCIUM 10 MG PO TABS
10.0000 mg | ORAL_TABLET | ORAL | 4 refills | Status: DC
  Filled 2023-02-16: qty 38, 88d supply, fill #0
  Filled 2023-06-30: qty 38, 88d supply, fill #1
  Filled 2023-10-08: qty 38, 88d supply, fill #2
  Filled 2024-01-10: qty 38, 88d supply, fill #3

## 2023-02-17 ENCOUNTER — Telehealth: Payer: Self-pay | Admitting: Adult Health

## 2023-02-17 ENCOUNTER — Other Ambulatory Visit (HOSPITAL_BASED_OUTPATIENT_CLINIC_OR_DEPARTMENT_OTHER): Payer: Self-pay

## 2023-02-17 ENCOUNTER — Other Ambulatory Visit: Payer: Self-pay | Admitting: Hematology and Oncology

## 2023-02-17 ENCOUNTER — Other Ambulatory Visit: Payer: Self-pay

## 2023-02-17 MED ORDER — TAMOXIFEN CITRATE 10 MG PO TABS
5.0000 mg | ORAL_TABLET | Freq: Every day | ORAL | 0 refills | Status: DC
  Filled 2023-02-17: qty 30, 60d supply, fill #0

## 2023-02-17 NOTE — Telephone Encounter (Signed)
Patient is aware of scheduled appointment times/dates for follow up

## 2023-02-18 ENCOUNTER — Other Ambulatory Visit: Payer: Self-pay

## 2023-02-18 ENCOUNTER — Other Ambulatory Visit (HOSPITAL_BASED_OUTPATIENT_CLINIC_OR_DEPARTMENT_OTHER): Payer: Self-pay

## 2023-02-18 MED ORDER — DOXYCYCLINE HYCLATE 50 MG PO CAPS
50.0000 mg | ORAL_CAPSULE | Freq: Every day | ORAL | 2 refills | Status: AC
  Filled 2023-02-18: qty 30, 30d supply, fill #0
  Filled 2023-05-27: qty 30, 30d supply, fill #1
  Filled 2023-09-17: qty 30, 30d supply, fill #2

## 2023-02-19 ENCOUNTER — Other Ambulatory Visit (HOSPITAL_BASED_OUTPATIENT_CLINIC_OR_DEPARTMENT_OTHER): Payer: Self-pay

## 2023-03-10 ENCOUNTER — Encounter: Payer: Self-pay | Admitting: Adult Health

## 2023-03-10 ENCOUNTER — Inpatient Hospital Stay: Payer: Medicare Other | Attending: Adult Health | Admitting: Adult Health

## 2023-03-10 VITALS — BP 123/59 | HR 91 | Temp 97.7°F | Resp 18 | Wt 141.4 lb

## 2023-03-10 DIAGNOSIS — Z87891 Personal history of nicotine dependence: Secondary | ICD-10-CM | POA: Insufficient documentation

## 2023-03-10 DIAGNOSIS — N6092 Unspecified benign mammary dysplasia of left breast: Secondary | ICD-10-CM | POA: Diagnosis present

## 2023-03-10 DIAGNOSIS — Z79810 Long term (current) use of selective estrogen receptor modulators (SERMs): Secondary | ICD-10-CM | POA: Insufficient documentation

## 2023-03-10 NOTE — Progress Notes (Signed)
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  It appears to increase the risk of breast cancer by 3 fold.  There is risk of both ipsilateral and contralateral breast cancers.  The cumulative incidence of breast cancer is approximately 1 %/year.   Based upon NCI breast cancer risk assessment tool: Risk of breast cancer is 21.7% (average risk 7.4%), 5-year risk 7.6%,  Risk Reduction: lifestyle modifications, Tamoxifen daily began 02/2021 Intensified screening: mammogram alternating with breast MRI (every other year instead of annually)  CURRENT THERAPY: tamoxifen  INTERVAL HISTORY: Miranda Shaw 67 y.o. female who is at high risk to develop breast cancer, presents for a routine follow-up. She has been on Tamoxifen since November 2022, which was initially started at a dose of 10mg  but was later decreased due to side effects. She reports daily hot and cold flashes, which she has become accustomed to. Her most recent mammogram in July 2024 showed no evidence of malignancy, and her last breast MRI was in December 2023. She denies any new breast changes or concerns.  In addition to her breast cancer risk, Miranda Shaw has recently discovered mold in her home, specifically in her closet and heating and air ducts. 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 This discovery was made a few months ago when she noticed mold on her leather shoes. She is unsure if she has experienced any physical symptoms from the mold exposure. The mold remediation process has been stressful and costly, with no assistance from her insurance.  Miranda Shaw also  mentions that she had COVID-19 before her high school reunion, which made her feel lethargic. However, she has a dog that keeps her active as she requires regular outdoor activity. She has no other new health concerns or changes in her condition.   Patient Active Problem List   Diagnosis Date Noted   Genetic testing 05/02/2021   Family history of breast cancer 04/10/2021   Family history of pancreatic cancer 04/10/2021   Family history of prostate cancer 04/10/2021   Atypical lobular hyperplasia [facility]) of left breast 02/18/2021    is allergic to bee venom.  MEDICAL HISTORY: Past Medical History:  Diagnosis Date   Cataract    bilaterally removed   Diabetes mellitus without complication (HCC)    Family history of breast cancer    Family history of pancreatic cancer    Family history of prostate cancer    Hypothyroidism    Shoulder impingement syndrome, right    Thyroid disease     SURGICAL HISTORY: Past Surgical History:  Procedure Laterality Date   BREAST EXCISIONAL BIOPSY Right 01/08/2021   BREAST LUMPECTOMY WITH RADIOACTIVE SEED LOCALIZATION Right 01/08/2021   Procedure: RIGHT BREAST LUMPECTOMY WITH RADIOACTIVE SEED LOCALIZATION;  Surgeon: Abigail Miyamoto, MD;  Location: [facility];  Service: General;  Laterality: Right;   COLONOSCOPY  2008-05   left shoulder surgery     right elbow surgery     SHOULDER ARTHROSCOPY WITH SUBACROMIAL DECOMPRESSION Right 09/17/2018   Procedure: SHOULDER ARTHROSCOPY WITH SUBACROMIAL DECOMPRESSION, DEBRIDEMENT;  Surgeon: Marcene Corning, MD;  Location:  SURGERY CENTER;  Service: Orthopedics;  Laterality: Right;   SHOULDER CLOSED REDUCTION Right 09/17/2018  Procedure: CLOSED MANIPULATION SHOULDER;  Surgeon: Marcene Corning, MD;  Location: [facility];  Service: Orthopedics;  Laterality: Right;    SOCIAL HISTORY: Social History   Socioeconomic History   Marital status: Single    Spouse name: Not on  file   Number of children: Not on file   Years of education: Not on file   Highest education level: Not on file  Occupational History   Not on file  Tobacco Use   Smoking status: Former    Current packs/day: 0.00    Types: Cigarettes    Quit date: 09/26/2001    Years since quitting: 21.4   Smokeless tobacco: Never  Vaping Use   Vaping status: Never Used  Substance and Sexual Activity   Alcohol use: Yes    Comment: socially   Drug use: No   Sexual activity: Not on file  Other Topics Concern   Not on file  Social History Narrative   Not on file   Social Determinants of Health   Financial Resource Strain: Not on file  Food Insecurity: Not on file  Transportation Needs: Not on file  Physical Activity: Not on file  Stress: Not on file  Social Connections: Not on file  Intimate Partner Violence: Not on file    FAMILY HISTORY: Family History  Problem Relation Age of Onset   Breast cancer Mother 96   Lung cancer Father        smoker   Breast cancer Maternal Aunt        dx 44s   Lung cancer Maternal Aunt    Lung cancer Maternal Uncle    Stroke Paternal Aunt    Breast cancer Paternal Aunt        dx 60s   Cancer Maternal Grandmother 47       Gallbladder   Pancreatic cancer Maternal Grandfather 48   Lung cancer Paternal Grandmother        non-smoker   Diabetes Paternal Grandfather    Stroke Paternal Grandfather    Colon cancer Neg Hx    Colon polyps Neg Hx    Esophageal cancer Neg Hx    Rectal cancer Neg Hx    Stomach cancer Neg Hx     Review of Systems  Constitutional:  Negative for appetite change, chills, fatigue, fever and unexpected weight change.  HENT:   Negative for hearing loss, lump/mass and trouble swallowing.   Eyes:  Negative for eye problems and icterus.  Respiratory:  Negative for chest tightness, cough and shortness of breath.   Cardiovascular:  Negative for chest pain, leg swelling and palpitations.  Gastrointestinal:  Negative for abdominal  distention, abdominal pain, constipation, diarrhea, nausea and vomiting.  Endocrine: Negative for hot flashes.  Genitourinary:  Negative for difficulty urinating.   Musculoskeletal:  Negative for arthralgias.  Skin:  Negative for itching and rash.  Neurological:  Negative for dizziness, extremity weakness, headaches and numbness.  Hematological:  Negative for adenopathy. Does not bruise/bleed easily.  Psychiatric/Behavioral:  Negative for depression. The patient is not nervous/anxious.       PHYSICAL EXAMINATION   Onc Performance Status - 03/10/23 1100       KPS SCALE   KPS % SCORE Normal, no compliants, no evidence of disease             Vitals:   03/10/23 1108  BP: (!) 123/59  Pulse: 91  Resp: 18  Temp: 97.7 F (36.5 C)  SpO2: 98%    Physical Exam  Constitutional:      General: She is not in acute distress.    Appearance: Normal appearance. She is not toxic-appearing.  HENT:     Head: Normocephalic and atraumatic.     Mouth/Throat:     Mouth: Mucous membranes are moist.     Pharynx: Oropharynx is clear. No oropharyngeal exudate or posterior oropharyngeal erythema.  Eyes:     General: No scleral icterus. Cardiovascular:     Rate and Rhythm: Normal rate and regular rhythm.     Pulses: Normal pulses.     Heart sounds: Normal heart sounds.  Pulmonary:     Effort: Pulmonary effort is normal.     Breath sounds: Normal breath sounds.  Chest:     Comments: Left breast status postlumpectomy, benign, right breast benign Abdominal:     General: Abdomen is flat. Bowel sounds are normal. There is no distension.     Palpations: Abdomen is soft.     Tenderness: There is no abdominal tenderness.  Musculoskeletal:        General: No swelling.     Cervical back: Neck supple.  Lymphadenopathy:     Cervical: No cervical adenopathy.     Upper Body:     Right upper body: No axillary adenopathy.     Left upper body: No axillary adenopathy.  Skin:    General: Skin is warm  and dry.     Findings: No rash.  Neurological:     General: No focal deficit present.     Mental Status: She is alert.  Psychiatric:        Mood and Affect: Mood normal.        Behavior: Behavior normal.      ASSESSMENT and THERAPY PLAN:   Atypical lobular hyperplasia (ALH) of left breast 01/08/2021: Left lumpectomy: Atypical lobular hyperplasia, sclerosing adenosis Atypical lobular hyperplasia: This is characterized by abnormal cells that are filling part of the lobule.  It appears to increase the risk of breast cancer by 3 fold.  There is risk of both ipsilateral and contralateral breast cancers.  The cumulative incidence of breast cancer is approximately 1 %/year.   Based upon NCI breast cancer risk assessment tool: Risk of breast cancer is 21.7% (average risk 7.4%), 5-year risk 7.6%,   High Risk for Breast Cancer No changes or concerns noted on breast exam. Adherence to Tamoxifen 5mg  daily with side effect of hot and cold flashes. Mammogram in July 2024 showed no evidence of malignancy. -Continue Tamoxifen 5mg  daily. -Next mammogram ordered for July 2025. -Next breast MRI MRI due in December 2025. -Provided lifestyle recommendations for further risk reduction.  Exposure to [facility] Patient reports recent discovery of mold in her home, specifically Penicillium and another unidentified type. No specific physical symptoms reported. -No specific plan discussed in the conversation.  RTC in 1 year for continued f/u.    All questions were answered. The patient knows to call the clinic with any problems, questions or concerns. We can certainly see the patient much sooner if necessary.  Total encounter time:30 minutes*in face-to-face visit time, chart review, lab review, care coordination, order entry, and documentation of the encounter time.   Lillard Anes, NP 03/10/23 1:43 PM Medical Oncology and Hematology [facility] [address] [place], [place] [address] Tel.  [phone]    Fax. [phone]  *Total Encounter Time as defined by the Centers for Medicare and Medicaid Services includes, in addition to the face-to-face time of a patient visit (documented in  the note above) non-face-to-face time: obtaining and reviewing outside history, ordering and reviewing medications, tests or procedures, care coordination (communications with other health care professionals or caregivers) and documentation in the medical record.

## 2023-03-10 NOTE — Assessment & Plan Note (Signed)
01/08/2021: Left lumpectomy: Atypical lobular hyperplasia, sclerosing adenosis Atypical lobular hyperplasia: This is characterized by abnormal cells that are filling part of the lobule.  It appears to increase the risk of breast cancer by 3 fold.  There is risk of both ipsilateral and contralateral breast cancers.  The cumulative incidence of breast cancer is approximately 1 %/year.   Based upon NCI breast cancer risk assessment tool: Risk of breast cancer is 21.7% (average risk 7.4%), 5-year risk 7.6%,   High Risk for Breast Cancer No changes or concerns noted on breast exam. Adherence to Tamoxifen 5mg  daily with side effect of hot and cold flashes. Mammogram in July 2024 showed no evidence of malignancy. -Continue Tamoxifen 5mg  daily. -Next mammogram ordered for July 2025. -Next breast MRI MRI due in December 2025. -Provided lifestyle recommendations for further risk reduction.  Exposure to [facility] Patient reports recent discovery of mold in her home, specifically Penicillium and another unidentified type. No specific physical symptoms reported. -No specific plan discussed in the conversation.  RTC in 1 year for continued f/u.

## 2023-04-27 ENCOUNTER — Other Ambulatory Visit: Payer: Self-pay | Admitting: Hematology and Oncology

## 2023-04-27 ENCOUNTER — Other Ambulatory Visit (HOSPITAL_BASED_OUTPATIENT_CLINIC_OR_DEPARTMENT_OTHER): Payer: Self-pay

## 2023-04-27 ENCOUNTER — Other Ambulatory Visit: Payer: Self-pay

## 2023-04-27 MED ORDER — EZETIMIBE 10 MG PO TABS
10.0000 mg | ORAL_TABLET | Freq: Every day | ORAL | 4 refills | Status: AC
  Filled 2023-04-27 – 2023-06-30 (×2): qty 90, 90d supply, fill #0
  Filled 2023-10-08: qty 90, 90d supply, fill #1
  Filled 2024-01-10: qty 90, 90d supply, fill #2
  Filled 2024-04-08: qty 90, 90d supply, fill #3

## 2023-04-27 MED ORDER — TAMOXIFEN CITRATE 10 MG PO TABS
5.0000 mg | ORAL_TABLET | Freq: Every day | ORAL | 0 refills | Status: DC
  Filled 2023-04-27: qty 30, 60d supply, fill #0

## 2023-04-27 MED ORDER — INSULIN LISPRO 100 UNIT/ML IJ SOLN
INTRAMUSCULAR | 3 refills | Status: AC
  Filled 2023-04-27: qty 90, 30d supply, fill #0
  Filled 2023-12-15: qty 90, 90d supply, fill #1
  Filled 2024-04-08: qty 90, 90d supply, fill #2

## 2023-04-27 MED ORDER — LEVOTHYROXINE SODIUM 200 MCG PO TABS
200.0000 ug | ORAL_TABLET | ORAL | 4 refills | Status: AC
  Filled 2023-04-27: qty 48, 84d supply, fill #0
  Filled 2023-06-30: qty 52, 90d supply, fill #0
  Filled 2023-10-08: qty 52, 90d supply, fill #1
  Filled 2024-01-10: qty 52, 90d supply, fill #2
  Filled 2024-04-08: qty 52, 90d supply, fill #3

## 2023-04-28 ENCOUNTER — Other Ambulatory Visit: Payer: Self-pay

## 2023-04-29 ENCOUNTER — Other Ambulatory Visit (HOSPITAL_BASED_OUTPATIENT_CLINIC_OR_DEPARTMENT_OTHER): Payer: Self-pay

## 2023-06-30 ENCOUNTER — Other Ambulatory Visit: Payer: Self-pay | Admitting: Hematology and Oncology

## 2023-06-30 ENCOUNTER — Other Ambulatory Visit (HOSPITAL_BASED_OUTPATIENT_CLINIC_OR_DEPARTMENT_OTHER): Payer: Self-pay

## 2023-06-30 MED ORDER — TAMOXIFEN CITRATE 10 MG PO TABS
5.0000 mg | ORAL_TABLET | Freq: Every day | ORAL | 2 refills | Status: DC
  Filled 2023-06-30: qty 30, 60d supply, fill #0
  Filled 2023-09-17: qty 30, 60d supply, fill #1
  Filled 2023-12-15: qty 30, 60d supply, fill #2

## 2023-08-03 ENCOUNTER — Other Ambulatory Visit (HOSPITAL_COMMUNITY): Payer: Self-pay | Admitting: *Deleted

## 2023-08-04 ENCOUNTER — Ambulatory Visit (HOSPITAL_COMMUNITY)
Admission: RE | Admit: 2023-08-04 | Discharge: 2023-08-04 | Disposition: A | Discharge status: Home / Self Care | Source: Ambulatory Visit | Attending: Endocrinology | Admitting: Endocrinology

## 2023-08-04 DIAGNOSIS — M810 Age-related osteoporosis without current pathological fracture: Secondary | ICD-10-CM | POA: Insufficient documentation

## 2023-08-04 MED ORDER — ZOLEDRONIC ACID 5 MG/100ML IV SOLN
INTRAVENOUS | Status: AC
  Administered 2023-08-04: 5 mg via INTRAVENOUS
  Filled 2023-08-04: qty 100

## 2023-08-04 MED ORDER — ZOLEDRONIC ACID 5 MG/100ML IV SOLN: 5.0000 mg | Freq: Once | INTRAVENOUS | Status: AC

## 2023-08-20 ENCOUNTER — Other Ambulatory Visit (HOSPITAL_BASED_OUTPATIENT_CLINIC_OR_DEPARTMENT_OTHER): Payer: Self-pay

## 2023-08-20 MED ORDER — NITROFURANTOIN MONOHYD MACRO 100 MG PO CAPS
100.0000 mg | ORAL_CAPSULE | Freq: Two times a day (BID) | ORAL | 0 refills | Status: AC
  Filled 2023-08-20: qty 10, 5d supply, fill #0

## 2023-09-16 ENCOUNTER — Other Ambulatory Visit: Payer: Self-pay | Admitting: Hematology and Oncology

## 2023-09-16 DIAGNOSIS — Z1231 Encounter for screening mammogram for malignant neoplasm of breast: Secondary | ICD-10-CM

## 2023-09-30 ENCOUNTER — Telehealth: Payer: Self-pay | Admitting: *Deleted

## 2023-09-30 NOTE — Telephone Encounter (Signed)
 Received call from pt with complaint of generalized joint pain x4-6 weeks and pt believes symptoms are related to Tamoxifen .  Per MD pt needing to stop Tamoxifen  x2 weeks and f/u with office for next steps.  Pt educated, appt scheduled, and verbalized understanding.

## 2023-10-14 ENCOUNTER — Ambulatory Visit
Admission: RE | Admit: 2023-10-14 | Discharge: 2023-10-14 | Disposition: A | Discharge status: Home / Self Care | Source: Ambulatory Visit | Attending: Hematology and Oncology | Admitting: Hematology and Oncology

## 2023-10-14 DIAGNOSIS — Z1231 Encounter for screening mammogram for malignant neoplasm of breast: Secondary | ICD-10-CM

## 2023-10-18 NOTE — Assessment & Plan Note (Signed)
 01/08/2021: Left lumpectomy: Atypical lobular hyperplasia, sclerosing adenosis Atypical lobular hyperplasia: This is characterized by abnormal cells that are filling part of the lobule.  It appears to increase the risk of breast cancer by 3 fold.  There is risk of both ipsilateral and contralateral breast cancers.  The cumulative incidence of breast cancer is approximately 1 %/year.   Based upon NCI breast cancer risk assessment tool: Risk of breast cancer is 21.7% (average risk 7.4%), 5-year risk 7.6%,  Breast Cancer Surveillance: Mammogram 10/14/23: Benign MRI Breast: 03/26/24: Benign, Density Cat B  1 Year follow up

## 2023-10-19 ENCOUNTER — Inpatient Hospital Stay: Attending: Hematology and Oncology | Admitting: Hematology and Oncology

## 2023-10-19 DIAGNOSIS — N6092 Unspecified benign mammary dysplasia of left breast: Secondary | ICD-10-CM | POA: Diagnosis not present

## 2023-10-19 NOTE — Progress Notes (Signed)
 HEMATOLOGY-ONCOLOGY TELEPHONE VISIT PROGRESS NOTE  I connected with our patient on 10/19/23 at  8:15 AM EDT by telephone and verified that I am speaking with the correct person using two identifiers.  I discussed the limitations, risks, security and privacy concerns of performing an evaluation and management service by telephone and the availability of in person appointments.  I also discussed with the patient that there may be a patient responsible charge related to this service. The patient expressed understanding and agreed to proceed.   History of Present Illness:   History of Present Illness Miranda Shaw is a 68 year old female who presents with dizziness and drooping eyelids.  Dizziness is characterized by a sensation of being off balance, occurring variably with or without positional changes. She often needs support to prevent falling. Dizziness began after a recent tamoxifen  refill, with only slight improvement after a two-week discontinuation. No vertigo is present.  Drooping eyelids interfere with vision, causing incidents like bumping into doors and difficulty with driving. Progressive lens glasses worsen vision, so she has stopped wearing them, leading to some improvement.  Blood sugar levels are stable, remaining in range over 80% of the time. She maintains an active lifestyle with long walks and adequate hydration.   REVIEW OF SYSTEMS:   Constitutional: Denies fevers, chills or abnormal weight loss All other systems were reviewed with the patient and are negative. Observations/Objective:     Assessment Plan:  Atypical lobular hyperplasia [facility]) of left breast 01/08/2021: Left lumpectomy: Atypical lobular hyperplasia, sclerosing adenosis Atypical lobular hyperplasia: This is characterized by abnormal cells that are filling part of the lobule.  It appears to increase the risk of breast cancer by 3 fold.  There is risk of both ipsilateral and contralateral breast  cancers.  The cumulative incidence of breast cancer is approximately 1 %/year.   Based upon NCI breast cancer risk assessment tool: Risk of breast cancer is 21.7% (average risk 7.4%), 5-year risk 7.6%,  Breast Cancer Surveillance: Mammogram 10/14/23: results pending. MRI Breast: 03/26/22: Benign, Density Cat B. MRIs every 2 years  Prevention: with Tamoxifen  5 mg daily since 11/2021 Toxicities: None Holding Tamoxifen  did not make a difference Restart tamoxifen  after her eyelid surgery  Dizzy Spells: with drooping eye lids (sees ophthalmology on 7/22) Seems like orthostatic hypotension Worse when she wears progressive lenses   1 Year follow up   Assessment & Plan Breast cancer surveillance Breast density low, enhancing mammogram effectiveness. Awaiting official mammogram report. Previous MRI normal. - Continue biennial MRI surveillance. - Schedule MRI in December. - Discuss mammogram results once available.  Dizziness Intermittent dizziness, suspected orthostatic hypotension. Tamoxifen  hold did not alleviate symptoms. Adequate hydration. No serious underlying conditions suspected. - Check blood pressure in different positions using a borrowed cuff. - Discuss blood pressure readings with primary care doctor. - Consider holding tamoxifen  for two to three weeks to assess for symptom resolution. - Stop tamoxifen  one week before eyelid surgery on July 22.  Drooping eyelids Drooping eyelids interfere with vision, especially peripheral vision and blind spot checking. - Proceed with scheduled eyelid muscle repair surgery on July 22.      I discussed the assessment and treatment plan with the patient. The patient was provided an opportunity to ask questions and all were answered. The patient agreed with the plan and demonstrated an understanding of the instructions. The patient was advised to call back or seek an in-person evaluation if the symptoms worsen or if the condition fails to  improve as anticipated.   I provided 20 minutes of non-face-to-face time during this encounter.  This includes time for charting and coordination of care   Naomi MARLA Chad, MD

## 2023-10-20 ENCOUNTER — Other Ambulatory Visit: Payer: Self-pay | Admitting: Hematology and Oncology

## 2023-10-20 DIAGNOSIS — R928 Other abnormal and inconclusive findings on diagnostic imaging of breast: Secondary | ICD-10-CM

## 2023-10-27 ENCOUNTER — Other Ambulatory Visit

## 2023-10-27 ENCOUNTER — Ambulatory Visit
Admission: RE | Admit: 2023-10-27 | Discharge: 2023-10-27 | Disposition: A | Discharge status: Home / Self Care | Source: Ambulatory Visit | Attending: Hematology and Oncology | Admitting: Hematology and Oncology

## 2023-10-27 DIAGNOSIS — R928 Other abnormal and inconclusive findings on diagnostic imaging of breast: Secondary | ICD-10-CM

## 2023-10-28 ENCOUNTER — Other Ambulatory Visit (HOSPITAL_BASED_OUTPATIENT_CLINIC_OR_DEPARTMENT_OTHER): Payer: Self-pay

## 2023-10-28 MED ORDER — NEOMYCIN-POLYMYXIN-DEXAMETH 3.5-10000-0.1 OP OINT
TOPICAL_OINTMENT | OPHTHALMIC | 0 refills | Status: DC
  Filled 2023-10-28: qty 3.5, 3d supply, fill #0

## 2023-10-29 ENCOUNTER — Other Ambulatory Visit: Payer: Self-pay

## 2023-10-29 ENCOUNTER — Other Ambulatory Visit (HOSPITAL_BASED_OUTPATIENT_CLINIC_OR_DEPARTMENT_OTHER): Payer: Self-pay

## 2023-10-29 MED ORDER — LORAZEPAM 1 MG PO TABS
1.0000 mg | ORAL_TABLET | ORAL | 0 refills | Status: DC
  Filled 2023-10-29: qty 2, 1d supply, fill #0

## 2023-10-29 MED ORDER — DIAZEPAM 5 MG PO TABS
5.0000 mg | ORAL_TABLET | ORAL | 0 refills | Status: DC
  Filled 2023-10-29: qty 2, 1d supply, fill #0

## 2023-12-02 ENCOUNTER — Other Ambulatory Visit (HOSPITAL_BASED_OUTPATIENT_CLINIC_OR_DEPARTMENT_OTHER): Payer: Self-pay

## 2023-12-02 MED ORDER — FLUOROMETHOLONE 0.1 % OP SUSP
1.0000 [drp] | Freq: Two times a day (BID) | OPHTHALMIC | 1 refills | Status: AC
  Filled 2023-12-02: qty 5, 25d supply, fill #0
  Filled 2023-12-18 – 2023-12-21 (×2): qty 5, 25d supply, fill #1

## 2023-12-15 ENCOUNTER — Other Ambulatory Visit (HOSPITAL_BASED_OUTPATIENT_CLINIC_OR_DEPARTMENT_OTHER): Payer: Self-pay

## 2023-12-15 ENCOUNTER — Other Ambulatory Visit: Payer: Self-pay

## 2023-12-16 ENCOUNTER — Other Ambulatory Visit (HOSPITAL_BASED_OUTPATIENT_CLINIC_OR_DEPARTMENT_OTHER): Payer: Self-pay

## 2023-12-16 ENCOUNTER — Other Ambulatory Visit: Payer: Self-pay

## 2023-12-18 ENCOUNTER — Other Ambulatory Visit (HOSPITAL_BASED_OUTPATIENT_CLINIC_OR_DEPARTMENT_OTHER): Payer: Self-pay

## 2023-12-21 ENCOUNTER — Other Ambulatory Visit: Payer: Self-pay

## 2023-12-21 ENCOUNTER — Other Ambulatory Visit (HOSPITAL_BASED_OUTPATIENT_CLINIC_OR_DEPARTMENT_OTHER): Payer: Self-pay

## 2023-12-21 ENCOUNTER — Other Ambulatory Visit (HOSPITAL_COMMUNITY): Payer: Self-pay

## 2024-01-07 ENCOUNTER — Other Ambulatory Visit (HOSPITAL_BASED_OUTPATIENT_CLINIC_OR_DEPARTMENT_OTHER): Payer: Self-pay

## 2024-01-10 ENCOUNTER — Other Ambulatory Visit: Payer: Self-pay | Admitting: Hematology and Oncology

## 2024-01-11 ENCOUNTER — Other Ambulatory Visit: Payer: Self-pay

## 2024-01-11 ENCOUNTER — Other Ambulatory Visit (HOSPITAL_BASED_OUTPATIENT_CLINIC_OR_DEPARTMENT_OTHER): Payer: Self-pay

## 2024-01-11 MED ORDER — TAMOXIFEN CITRATE 10 MG PO TABS
5.0000 mg | ORAL_TABLET | Freq: Every day | ORAL | 2 refills | Status: DC
  Filled 2024-01-11 – 2024-03-07 (×2): qty 30, 60d supply, fill #0

## 2024-01-15 ENCOUNTER — Other Ambulatory Visit (HOSPITAL_BASED_OUTPATIENT_CLINIC_OR_DEPARTMENT_OTHER): Payer: Self-pay

## 2024-01-18 ENCOUNTER — Other Ambulatory Visit (HOSPITAL_BASED_OUTPATIENT_CLINIC_OR_DEPARTMENT_OTHER): Payer: Self-pay

## 2024-01-18 MED ORDER — ALENDRONATE SODIUM 70 MG PO TABS
70.0000 mg | ORAL_TABLET | ORAL | 11 refills | Status: DC
  Filled 2024-01-18: qty 4, 28d supply, fill #0

## 2024-01-27 ENCOUNTER — Other Ambulatory Visit (HOSPITAL_BASED_OUTPATIENT_CLINIC_OR_DEPARTMENT_OTHER): Payer: Self-pay

## 2024-02-19 ENCOUNTER — Telehealth: Payer: Self-pay | Admitting: Adult Health

## 2024-02-19 NOTE — Telephone Encounter (Signed)
 left vm for pt about rescheduled appt date and time. Encourgaed to call back if this need to be re-rescheduled

## 2024-03-07 ENCOUNTER — Other Ambulatory Visit (HOSPITAL_BASED_OUTPATIENT_CLINIC_OR_DEPARTMENT_OTHER): Payer: Self-pay

## 2024-03-07 ENCOUNTER — Other Ambulatory Visit: Payer: Self-pay

## 2024-03-08 ENCOUNTER — Other Ambulatory Visit (HOSPITAL_BASED_OUTPATIENT_CLINIC_OR_DEPARTMENT_OTHER): Payer: Self-pay

## 2024-03-08 MED ORDER — SODIUM FLUORIDE 1.1 % DT CREA
TOPICAL_CREAM | DENTAL | 11 refills | Status: AC
  Filled 2024-03-08: qty 51, 30d supply, fill #0

## 2024-03-11 ENCOUNTER — Ambulatory Visit: Payer: Medicare Other | Admitting: Adult Health

## 2024-03-14 ENCOUNTER — Encounter: Payer: Self-pay | Admitting: Adult Health

## 2024-03-14 ENCOUNTER — Inpatient Hospital Stay: Attending: Adult Health | Admitting: Adult Health

## 2024-03-14 VITALS — BP 90/70 | HR 69 | Temp 97.6°F | Resp 17 | Ht 65.5 in | Wt 145.0 lb

## 2024-03-14 DIAGNOSIS — Z79810 Long term (current) use of selective estrogen receptor modulators (SERMs): Secondary | ICD-10-CM | POA: Insufficient documentation

## 2024-03-14 DIAGNOSIS — N6022 Fibroadenosis of left breast: Secondary | ICD-10-CM | POA: Insufficient documentation

## 2024-03-14 DIAGNOSIS — N6092 Unspecified benign mammary dysplasia of left breast: Secondary | ICD-10-CM | POA: Diagnosis not present

## 2024-03-14 NOTE — Progress Notes (Unsigned)
 [facility] Cancer Follow up:    Miranda Senior, MD 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  It appears to increase the risk of breast cancer by 3 fold.  There is risk of both ipsilateral and contralateral breast cancers.  The cumulative incidence of breast cancer is approximately 1 %/year.   Based upon NCI breast cancer risk assessment tool: Risk of breast cancer is 21.7% (average risk 7.4%), 5-year risk 7.6%,  CURRENT THERAPY:  INTERVAL HISTORY:  Discussed the use of AI scribe software for clinical note transcription with the patient, who gave verbal consent to proceed.  History of Present Illness Miranda Shaw is a 68 year old female who presents for a high risk visit.  She has ongoing eye scratchiness and dryness after recent eyelid muscle repair surgery. 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  Her prior dizziness while on tamoxifen  has resolved after the surgery. She remains on tamoxifen , which was held for one week before surgery, and she tolerates it well. Her breast cancer screening plan includes annual mammograms and breast MRIs every two years, with the next MRI due this year.  She reports skin pain in the past and has had a couple of basal cell carcinomas removed.     Patient Active Problem List   Diagnosis Date Noted  . Genetic testing 05/02/2021  . Family history of breast cancer 04/10/2021  . Family history of pancreatic cancer 04/10/2021  . Family history of prostate cancer 04/10/2021  . 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  MEDICAL HISTORY: Past Medical History:  Diagnosis Date  . Cataract    bilaterally removed  . Diabetes mellitus without complication (HCC)   . Family history of breast cancer   . Family history of pancreatic cancer   . Family history of prostate cancer   . Hypothyroidism   . Shoulder impingement syndrome, right   . Thyroid  disease     SURGICAL HISTORY: Past Surgical History:  Procedure Laterality Date  . BREAST EXCISIONAL BIOPSY Right 01/08/2021  . BREAST LUMPECTOMY WITH RADIOACTIVE SEED LOCALIZATION Right 01/08/2021   Procedure: RIGHT BREAST LUMPECTOMY WITH RADIOACTIVE SEED LOCALIZATION;  Surgeon: Vernetta Berg, MD;  Location: [facility];  Service: General;  Laterality: Right;  . COLONOSCOPY  2008-05  . left shoulder surgery    . right elbow surgery    . SHOULDER ARTHROSCOPY WITH SUBACROMIAL DECOMPRESSION Right 09/17/2018   Procedure: SHOULDER ARTHROSCOPY WITH SUBACROMIAL DECOMPRESSION, DEBRIDEMENT;  Surgeon: Sheril Coy, MD;  Location: [facility];  Service: Orthopedics;  Laterality: Right;  . SHOULDER CLOSED REDUCTION Right 09/17/2018   Procedure: CLOSED MANIPULATION SHOULDER;  Surgeon: Sheril Coy, MD;  Location: [facility];  Service: Orthopedics;  Laterality: Right;    SOCIAL HISTORY: Social History   Socioeconomic History  . Marital status: Single    Spouse name: Not on file  . Number of children: Not on file  . Years of education: Not on file  . Highest education level: Not on file  Occupational History  . Not on file  Tobacco Use  . Smoking  status: Former    Current packs/day: 0.00    Types: Cigarettes    Quit date: 09/26/2001    Years since quitting: 22.4  . Smokeless tobacco: Never  Vaping Use  . Vaping status: Never Used  Substance and Sexual Activity  . Alcohol  use: Yes    Alcohol /week: 2.0 standard drinks of alcohol     Types: 2 Glasses of wine per week    Comment:  socially  . Drug use: No  . Sexual activity: Not on file  Other Topics Concern  . Not on file  Social History Narrative  . Not on file   Social Drivers of Health   Financial Resource Strain: Low Risk  (03/14/2024)   Overall Financial Resource Strain (CARDIA)   . Difficulty of Paying Living Expenses: Not hard at all  Food Insecurity: No Food Insecurity (03/14/2024)   Hunger Vital Sign   . Worried About Programme Researcher, Broadcasting/film/video in the Last Year: Never true   . Ran Out of Food in the Last Year: Never true  Transportation Needs: No Transportation Needs (03/14/2024)   PRAPARE - Transportation   . Lack of Transportation (Medical): No   . Lack of Transportation (Non-Medical): No  Physical Activity: Not on file  Stress: No Stress Concern Present (03/14/2024)   [employer] of Occupational Health - Occupational Stress Questionnaire   . Feeling of Stress: Not at all  Social Connections: Moderately Integrated (03/14/2024)   Social Connection and Isolation Panel   . Frequency of Communication with Friends and Family: Twice a week   . Frequency of Social Gatherings with Friends and Family: More than three times a week   . Attends Religious Services: More than 4 times per year   . Active Member of Clubs or Organizations: Yes   . Attends Banker Meetings: 1 to 4 times per year   . Marital Status: Never married  Intimate Partner Violence: Not At Risk (03/14/2024)   Humiliation, Afraid, Rape, and Kick questionnaire   . Fear of Current or Ex-Partner: No   . Emotionally Abused: No   . Physically Abused: No   . Sexually Abused: No    FAMILY HISTORY: Family History  Problem Relation Age of Onset  . Breast cancer Mother 61  . Lung cancer Father        smoker  . Breast cancer Maternal Aunt        dx 77s  . Lung cancer Maternal Aunt   . Lung cancer Maternal Uncle   . Stroke Paternal Aunt   . Breast cancer Paternal Aunt        dx 50s  . Cancer Maternal Grandmother 48        Gallbladder  . Pancreatic cancer Maternal Grandfather 60  . Lung cancer Paternal Grandmother        non-smoker  . Diabetes Paternal Grandfather   . Stroke Paternal Grandfather   . Colon cancer Neg Hx   . Colon polyps Neg Hx   . Esophageal cancer Neg Hx   . Rectal cancer Neg Hx   . Stomach cancer Neg Hx     Review of Systems - Oncology    PHYSICAL EXAMINATION    Vitals:   03/14/24 1152  BP: 90/70  Pulse: 69  Resp: 17  Temp: 97.6 F (36.4 C)  SpO2: 98%    Physical Exam  LABORATORY DATA:  CBC No results found for: WBC, RBC, HGB, HCT, PLT, MCV, MCH, MCHC, RDW, LYMPHSABS, MONOABS, EOSABS, BASOSABS  CMP     Component Value Date/Time   NA 134 (L) 01/03/2021 1730   K 4.5 01/03/2021 1730   CL 102 01/03/2021 1730   CO2 23 01/03/2021 1730   GLUCOSE 325 (H) 01/03/2021 1730   BUN 9 01/03/2021 1730   CREATININE 0.80 01/03/2021 1730   CALCIUM  9.2 01/03/2021 1730   GFRNONAA >60 01/03/2021 1730   GFRAA >60 09/14/2018 0858     ASSESSMENT and THERAPY PLAN:   No problem-specific Assessment & Plan notes found for this encounter.   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 MRI scheduled. - Continue tamoxifen  therapy. - Proceed with scheduled breast MRI. - Perform annual clinical breast exams. - Schedule follow-up visit in one year or sooner if new or unusual symptoms arise.      All questions were answered. The patient knows to call the clinic with any problems, questions or concerns. We can certainly see the patient much sooner if necessary.  Total encounter time:*** minutes*in face-to-face visit time, chart review, lab review, care coordination, order entry, and documentation of the encounter time.    Morna Kendall, NP 03/14/24 12:14 PM Medical Oncology and Hematology [facility] [address] [place], [place] [address] Tel. [phone]    Fax. [phone]  *Total Encounter Time as defined by the Centers for Medicare and Medicaid Services includes, in addition to the face-to-face time of a patient visit (documented in the note above) non-face-to-face time: obtaining and reviewing outside history, ordering and reviewing medications, tests or procedures, care coordination (communications with other health care professionals or caregivers) and documentation in the medical record.

## 2024-03-28 ENCOUNTER — Ambulatory Visit
Admission: RE | Admit: 2024-03-28 | Discharge: 2024-03-28 | Disposition: A | Source: Ambulatory Visit | Attending: Hematology and Oncology

## 2024-03-28 DIAGNOSIS — N6092 Unspecified benign mammary dysplasia of left breast: Secondary | ICD-10-CM

## 2024-03-28 MED ORDER — GADOPICLENOL 0.5 MMOL/ML IV SOLN
7.0000 mL | Freq: Once | INTRAVENOUS | Status: AC | PRN
  Administered 2024-03-28: 13:00:00 7 mL via INTRAVENOUS

## 2024-04-08 ENCOUNTER — Other Ambulatory Visit (HOSPITAL_BASED_OUTPATIENT_CLINIC_OR_DEPARTMENT_OTHER): Payer: Self-pay

## 2024-04-08 ENCOUNTER — Other Ambulatory Visit: Payer: Self-pay

## 2024-04-08 MED ORDER — ROSUVASTATIN CALCIUM 10 MG PO TABS
10.0000 mg | ORAL_TABLET | ORAL | 4 refills | Status: AC
  Filled 2024-04-08: qty 40, 94d supply, fill #0

## 2024-04-11 ENCOUNTER — Other Ambulatory Visit (HOSPITAL_BASED_OUTPATIENT_CLINIC_OR_DEPARTMENT_OTHER): Payer: Self-pay

## 2024-04-12 ENCOUNTER — Other Ambulatory Visit (HOSPITAL_BASED_OUTPATIENT_CLINIC_OR_DEPARTMENT_OTHER): Payer: Self-pay

## 2024-04-13 ENCOUNTER — Other Ambulatory Visit (HOSPITAL_COMMUNITY): Payer: Self-pay

## 2025-03-16 ENCOUNTER — Inpatient Hospital Stay: Admitting: Adult Health
